# Patient Record
Sex: Male | Born: 1950 | Race: Black or African American | Hispanic: No | Marital: Single | Smoking: Current every day smoker
Health system: Southern US, Community
[De-identification: ages and names within clinical notes are randomized; demographics above are authoritative.]

## PROBLEM LIST (undated history)

## (undated) DIAGNOSIS — I509 Heart failure, unspecified: Secondary | ICD-10-CM

## (undated) DIAGNOSIS — W3400XA Accidental discharge from unspecified firearms or gun, initial encounter: Secondary | ICD-10-CM

## (undated) DIAGNOSIS — I10 Essential (primary) hypertension: Secondary | ICD-10-CM

## (undated) DIAGNOSIS — J449 Chronic obstructive pulmonary disease, unspecified: Secondary | ICD-10-CM

## (undated) DIAGNOSIS — I38 Endocarditis, valve unspecified: Secondary | ICD-10-CM

---

## 2008-09-06 ENCOUNTER — Emergency Department (HOSPITAL_COMMUNITY): Admission: EM | Admit: 2008-09-06 | Discharge: 2008-09-06 | Payer: Self-pay | Admitting: Emergency Medicine

## 2008-09-13 ENCOUNTER — Emergency Department (HOSPITAL_COMMUNITY): Admission: EM | Admit: 2008-09-13 | Discharge: 2008-09-13 | Payer: Self-pay | Admitting: Emergency Medicine

## 2008-09-18 ENCOUNTER — Encounter: Admission: RE | Admit: 2008-09-18 | Discharge: 2008-09-18 | Payer: Self-pay | Admitting: Family Medicine

## 2008-10-06 ENCOUNTER — Emergency Department (HOSPITAL_COMMUNITY): Admission: EM | Admit: 2008-10-06 | Discharge: 2008-10-06 | Payer: Self-pay | Admitting: Emergency Medicine

## 2008-10-13 ENCOUNTER — Emergency Department (HOSPITAL_COMMUNITY): Admission: EM | Admit: 2008-10-13 | Discharge: 2008-10-13 | Payer: Self-pay | Admitting: Emergency Medicine

## 2008-11-04 ENCOUNTER — Emergency Department (HOSPITAL_COMMUNITY): Admission: EM | Admit: 2008-11-04 | Discharge: 2008-11-04 | Payer: Self-pay | Admitting: Emergency Medicine

## 2009-09-11 ENCOUNTER — Inpatient Hospital Stay (HOSPITAL_COMMUNITY): Admission: EM | Admit: 2009-09-11 | Discharge: 2009-09-14 | Payer: Self-pay | Admitting: Emergency Medicine

## 2009-09-11 ENCOUNTER — Ambulatory Visit: Payer: Self-pay | Admitting: Cardiovascular Disease

## 2009-09-13 ENCOUNTER — Encounter: Payer: Self-pay | Admitting: Internal Medicine

## 2009-09-14 ENCOUNTER — Encounter (INDEPENDENT_AMBULATORY_CARE_PROVIDER_SITE_OTHER): Payer: Self-pay | Admitting: Internal Medicine

## 2009-11-05 ENCOUNTER — Emergency Department: Payer: Self-pay | Admitting: Internal Medicine

## 2009-12-22 ENCOUNTER — Emergency Department: Payer: Self-pay | Admitting: Emergency Medicine

## 2010-01-18 ENCOUNTER — Emergency Department: Payer: Self-pay | Admitting: Emergency Medicine

## 2010-02-09 ENCOUNTER — Emergency Department: Payer: Self-pay | Admitting: Emergency Medicine

## 2010-03-11 ENCOUNTER — Emergency Department: Payer: Self-pay | Admitting: Emergency Medicine

## 2010-04-14 ENCOUNTER — Emergency Department: Payer: Self-pay | Admitting: Emergency Medicine

## 2010-09-19 LAB — BASIC METABOLIC PANEL
BUN: 22 mg/dL (ref 6–23)
CO2: 25 mEq/L (ref 19–32)
CO2: 26 mEq/L (ref 19–32)
CO2: 28 mEq/L (ref 19–32)
Calcium: 8.8 mg/dL (ref 8.4–10.5)
Calcium: 9.3 mg/dL (ref 8.4–10.5)
Chloride: 109 mEq/L (ref 96–112)
Creatinine, Ser: 1.01 mg/dL (ref 0.4–1.5)
Creatinine, Ser: 1.2 mg/dL (ref 0.4–1.5)
GFR calc Af Amer: >60 mL/min (ref >60)
GFR calc Af Amer: >60 mL/min (ref >60)
GFR calc non Af Amer: 55 mL/min — ABNORMAL LOW (ref >60)
Glucose, Bld: 106 mg/dL — ABNORMAL HIGH (ref 70–99)
Glucose, Bld: 107 mg/dL — ABNORMAL HIGH (ref 70–99)
Glucose, Bld: 84 mg/dL (ref 70–99)
Potassium: 3.7 mEq/L (ref 3.5–5.1)
Sodium: 138 mEq/L (ref 135–145)
Sodium: 139 mEq/L (ref 135–145)

## 2010-09-19 LAB — CBC
HCT: 42.8 % (ref 39.0–52.0)
Hemoglobin: 12.7 g/dL — ABNORMAL LOW (ref 13.0–17.0)
Hemoglobin: 14.2 g/dL (ref 13.0–17.0)
MCHC: 33 g/dL (ref 30.0–36.0)
MCHC: 33.1 g/dL (ref 30.0–36.0)
MCV: 98.7 fL (ref 78.0–100.0)
RBC: 3.9 MIL/uL — ABNORMAL LOW (ref 4.22–5.81)
RBC: 4.38 MIL/uL (ref 4.22–5.81)
RDW: 14.2 % (ref 11.5–15.5)

## 2010-09-19 LAB — TSH: TSH: 1.302 u[IU]/mL (ref 0.350–4.500)

## 2010-09-19 LAB — HEPATIC FUNCTION PANEL
ALT: 12 U/L (ref 0–53)
AST: 21 U/L (ref 0–37)
Albumin: 3.2 g/dL — ABNORMAL LOW (ref 3.5–5.2)
Alkaline Phosphatase: 51 U/L (ref 39–117)
Bilirubin, Direct: 0.1 mg/dL (ref 0.0–0.3)
Total Bilirubin: 0.5 mg/dL (ref 0.3–1.2)

## 2010-09-19 LAB — POCT CARDIAC MARKERS
CKMB, poc: 2.2 ng/mL (ref 1.0–8.0)
Troponin i, poc: <0.05 ng/mL (ref 0.00–0.09)

## 2010-09-19 LAB — DIFFERENTIAL
Eosinophils Relative: 5 % (ref 0–5)
Lymphocytes Relative: 40 % (ref 12–46)
Monocytes Absolute: 0.4 10*3/uL (ref 0.1–1.0)
Monocytes Relative: 9 % (ref 3–12)
Neutro Abs: 2.3 10*3/uL (ref 1.7–7.7)

## 2010-09-19 LAB — RAPID URINE DRUG SCREEN, HOSP PERFORMED
Barbiturates: NOT DETECTED
Benzodiazepines: NOT DETECTED

## 2010-09-19 LAB — URINALYSIS, ROUTINE W REFLEX MICROSCOPIC
Bilirubin Urine: NEGATIVE
Hgb urine dipstick: NEGATIVE
Ketones, ur: NEGATIVE mg/dL
Nitrite: NEGATIVE
Protein, ur: NEGATIVE mg/dL
Urobilinogen, UA: 0.2 mg/dL (ref 0.0–1.0)

## 2010-09-19 LAB — CARDIAC PANEL(CRET KIN+CKTOT+MB+TROPI): CK, MB: 3.4 ng/mL (ref 0.3–4.0)

## 2010-09-19 LAB — CK TOTAL AND CKMB (NOT AT ARMC)
CK, MB: 4.7 ng/mL — ABNORMAL HIGH (ref 0.3–4.0)
Relative Index: 3.5 — ABNORMAL HIGH (ref 0.0–2.5)

## 2010-09-19 LAB — HEMOGLOBIN A1C: Mean Plasma Glucose: 126 mg/dL

## 2010-09-19 LAB — LIPID PANEL
Cholesterol: 151 mg/dL (ref 0–200)
Triglycerides: 51 mg/dL (ref <150)

## 2010-09-19 LAB — TROPONIN I: Troponin I: 0.01 ng/mL (ref 0.00–0.06)

## 2010-10-04 LAB — DIFFERENTIAL
Basophils Absolute: 0 10*3/uL (ref 0.0–0.1)
Lymphocytes Relative: 22 % (ref 12–46)
Monocytes Absolute: 0.4 10*3/uL (ref 0.1–1.0)
Monocytes Relative: 9 % (ref 3–12)
Neutro Abs: 2.8 10*3/uL (ref 1.7–7.7)

## 2010-10-04 LAB — POCT CARDIAC MARKERS
CKMB, poc: 4.7 ng/mL (ref 1.0–8.0)
Troponin i, poc: <0.05 ng/mL (ref 0.00–0.09)

## 2010-10-04 LAB — BASIC METABOLIC PANEL
Calcium: 9.2 mg/dL (ref 8.4–10.5)
GFR calc Af Amer: >60 mL/min (ref >60)
GFR calc non Af Amer: >60 mL/min (ref >60)
Sodium: 139 mEq/L (ref 135–145)

## 2010-10-04 LAB — RAPID URINE DRUG SCREEN, HOSP PERFORMED
Amphetamines: NOT DETECTED
Barbiturates: NOT DETECTED
Tetrahydrocannabinol: NOT DETECTED

## 2010-10-04 LAB — CBC
Hemoglobin: 13.8 g/dL (ref 13.0–17.0)
RBC: 4.26 MIL/uL (ref 4.22–5.81)

## 2015-01-30 ENCOUNTER — Other Ambulatory Visit: Payer: Self-pay

## 2015-01-30 ENCOUNTER — Encounter: Payer: Self-pay | Admitting: Emergency Medicine

## 2015-01-30 ENCOUNTER — Emergency Department
Admission: EM | Admit: 2015-01-30 | Discharge: 2015-01-30 | Disposition: A | Discharge status: Home / Self Care | Payer: Medicare Other | Attending: Emergency Medicine | Admitting: Emergency Medicine

## 2015-01-30 DIAGNOSIS — J441 Chronic obstructive pulmonary disease with (acute) exacerbation: Secondary | ICD-10-CM | POA: Insufficient documentation

## 2015-01-30 DIAGNOSIS — Z720 Tobacco use: Secondary | ICD-10-CM | POA: Diagnosis not present

## 2015-01-30 DIAGNOSIS — Z760 Encounter for issue of repeat prescription: Secondary | ICD-10-CM

## 2015-01-30 DIAGNOSIS — J449 Chronic obstructive pulmonary disease, unspecified: Secondary | ICD-10-CM

## 2015-01-30 DIAGNOSIS — I10 Essential (primary) hypertension: Secondary | ICD-10-CM | POA: Diagnosis not present

## 2015-01-30 HISTORY — DX: Essential (primary) hypertension: I10

## 2015-01-30 HISTORY — DX: Accidental discharge from unspecified firearms or gun, initial encounter: W34.00XA

## 2015-01-30 HISTORY — DX: Chronic obstructive pulmonary disease, unspecified: J44.9

## 2015-01-30 HISTORY — DX: Endocarditis, valve unspecified: I38

## 2015-01-30 MED ORDER — FLUTICASONE-SALMETEROL 500-50 MCG/DOSE IN AEPB: 1.0000 | INHALATION_SPRAY | Freq: Two times a day (BID) | RESPIRATORY_TRACT | Status: DC

## 2015-01-30 MED ORDER — ALBUTEROL SULFATE HFA 108 (90 BASE) MCG/ACT IN AERS: 2.0000 | INHALATION_SPRAY | Freq: Four times a day (QID) | RESPIRATORY_TRACT | Status: DC | PRN

## 2015-01-30 NOTE — Discharge Instructions (Signed)
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
## 2015-01-30 NOTE — ED Notes (Signed)
Pt to lobby to call for ride. Meal and drink given as well as a blanket given to take to the lobby.

## 2015-01-30 NOTE — ED Notes (Signed)
Pt arrived via EMS from home.  Pt states he is unable to get his prescriptions from the pharmacy Advair and Proair.  Pt states the pharmacy is holding his medications. Pt is trying to refill medications 10 days too soon. Denies any chest pain or respiratory difficulty. Pt states he is not short of breath right now but that it comes and goes. BP elevated also and is on blood pressure medications but does not know the name.

## 2015-11-16 ENCOUNTER — Emergency Department
Admission: EM | Admit: 2015-11-16 | Discharge: 2015-11-16 | Disposition: A | Discharge status: Home / Self Care | Payer: Medicare Other | Attending: Emergency Medicine | Admitting: Emergency Medicine

## 2015-11-16 ENCOUNTER — Encounter: Payer: Self-pay | Admitting: Emergency Medicine

## 2015-11-16 ENCOUNTER — Emergency Department: Payer: Medicare Other

## 2015-11-16 DIAGNOSIS — F17210 Nicotine dependence, cigarettes, uncomplicated: Secondary | ICD-10-CM | POA: Diagnosis not present

## 2015-11-16 DIAGNOSIS — Z760 Encounter for issue of repeat prescription: Secondary | ICD-10-CM | POA: Diagnosis not present

## 2015-11-16 DIAGNOSIS — I10 Essential (primary) hypertension: Secondary | ICD-10-CM | POA: Insufficient documentation

## 2015-11-16 DIAGNOSIS — J42 Unspecified chronic bronchitis: Secondary | ICD-10-CM | POA: Diagnosis not present

## 2015-11-16 DIAGNOSIS — R0600 Dyspnea, unspecified: Secondary | ICD-10-CM | POA: Diagnosis present

## 2015-11-16 DIAGNOSIS — J410 Simple chronic bronchitis: Secondary | ICD-10-CM

## 2015-11-16 MED ORDER — LISINOPRIL-HYDROCHLOROTHIAZIDE 10-12.5 MG PO TABS: 1.0000 | ORAL_TABLET | Freq: Every day | ORAL | Status: AC

## 2015-11-16 MED ORDER — ALBUTEROL SULFATE HFA 108 (90 BASE) MCG/ACT IN AERS: 2.0000 | INHALATION_SPRAY | Freq: Four times a day (QID) | RESPIRATORY_TRACT | Status: AC | PRN

## 2015-11-16 MED ORDER — FLUTICASONE-SALMETEROL 250-50 MCG/DOSE IN AEPB: 1.0000 | INHALATION_SPRAY | Freq: Two times a day (BID) | RESPIRATORY_TRACT | Status: AC

## 2015-11-16 MED ORDER — LISINOPRIL 10 MG PO TABS
20.0000 mg | ORAL_TABLET | Freq: Once | ORAL | Status: AC
  Administered 2015-11-16: 20 mg via ORAL
  Filled 2015-11-16: qty 2

## 2015-11-16 NOTE — ED Notes (Addendum)
Charted on wrong pt.
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
## 2015-11-16 NOTE — Discharge Instructions (Signed)
Chronic Obstructive Pulmonary Disease Chronic obstructive pulmonary disease (COPD) is a common lung condition in which airflow from the lungs is limited. COPD is a general term that can be used to describe many different lung problems that limit airflow, including both chronic bronchitis and emphysema. If you have COPD, your lung function will probably never return to normal, but there are measures you can take to improve lung function and make yourself feel better. CAUSES   Smoking (common).  Exposure to secondhand smoke.  Genetic problems.  Chronic inflammatory lung diseases or recurrent infections. SYMPTOMS  Shortness of breath, especially with physical activity.  Deep, persistent (chronic) cough with a large amount of thick mucus.  Wheezing.  Rapid breaths (tachypnea).  Gray or bluish discoloration (cyanosis) of the skin, especially in your fingers, toes, or lips.  Fatigue.  Weight loss.  Frequent infections or episodes when breathing symptoms become much worse (exacerbations).  Chest tightness. DIAGNOSIS Your health care provider will take a medical history and perform a physical examination to diagnose COPD. Additional tests for COPD may include:  Lung (pulmonary) function tests.  Chest X-ray.  CT scan.  Blood tests. TREATMENT  Treatment for COPD may include:  Inhaler and nebulizer medicines. These help manage the symptoms of COPD and make your breathing more comfortable.  Supplemental oxygen. Supplemental oxygen is only helpful if you have a low oxygen level in your blood.  Exercise and physical activity. These are beneficial for nearly all people with COPD.  Lung surgery or transplant.  Nutrition therapy to gain weight, if you are underweight.  Pulmonary rehabilitation. This may involve working with a team of health care providers and specialists, such as respiratory, occupational, and physical therapists. HOME CARE INSTRUCTIONS  Take all medicines  (inhaled or pills) as directed by your health care provider.  Avoid over-the-counter medicines or cough syrups that dry up your airway (such as antihistamines) and slow down the elimination of secretions unless instructed otherwise by your health care provider.  If you are a smoker, the most important thing that you can do is stop smoking. Continuing to smoke will cause further lung damage and breathing trouble. Ask your health care provider for help with quitting smoking. He or she can direct you to community resources or hospitals that provide support.  Avoid exposure to irritants such as smoke, chemicals, and fumes that aggravate your breathing.  Use oxygen therapy and pulmonary rehabilitation if directed by your health care provider. If you require home oxygen therapy, ask your health care provider whether you should purchase a pulse oximeter to measure your oxygen level at home.  Avoid contact with individuals who have a contagious illness.  Avoid extreme temperature and humidity changes.  Eat healthy foods. Eating smaller, more frequent meals and resting before meals may help you maintain your strength.  Stay active, but balance activity with periods of rest. Exercise and physical activity will help you maintain your ability to do things you want to do.  Preventing infection and hospitalization is very important when you have COPD. Make sure to receive all the vaccines your health care provider recommends, especially the pneumococcal and influenza vaccines. Ask your health care provider whether you need a pneumonia vaccine.  Learn and use relaxation techniques to manage stress.  Learn and use controlled breathing techniques as directed by your health care provider. Controlled breathing techniques include:  Pursed lip breathing. Start by breathing in (inhaling) through your nose for 1 second. Then, purse your lips as if you were   going to whistle and breathe out (exhale) through the  pursed lips for 2 seconds.  Diaphragmatic breathing. Start by putting one hand on your abdomen just above your waist. Inhale slowly through your nose. The hand on your abdomen should move out. Then purse your lips and exhale slowly. You should be able to feel the hand on your abdomen moving in as you exhale.  Learn and use controlled coughing to clear mucus from your lungs. Controlled coughing is a series of short, progressive coughs. The steps of controlled coughing are: 1. Lean your head slightly forward. 2. Breathe in deeply using diaphragmatic breathing. 3. Try to hold your breath for 3 seconds. 4. Keep your mouth slightly open while coughing twice. 5. Spit any mucus out into a tissue. 6. Rest and repeat the steps once or twice as needed. SEEK MEDICAL CARE IF:  You are coughing up more mucus than usual.  There is a change in the color or thickness of your mucus.  Your breathing is more labored than usual.  Your breathing is faster than usual. SEEK IMMEDIATE MEDICAL CARE IF:  You have shortness of breath while you are resting.  You have shortness of breath that prevents you from:  Being able to talk.  Performing your usual physical activities.  You have chest pain lasting longer than 5 minutes.  Your skin color is more cyanotic than usual.  You measure low oxygen saturations for longer than 5 minutes with a pulse oximeter. MAKE SURE YOU:  Understand these instructions.  Will watch your condition.  Will get help right away if you are not doing well or get worse.   This information is not intended to replace advice given to you by your health care provider. Make sure you discuss any questions you have with your health care provider.   Document Released: 03/22/2005 Document Revised: 07/03/2014 Document Reviewed: 02/06/2013 Elsevier Interactive Patient Education 2016 [employer]  

## 2015-11-16 NOTE — ED Notes (Signed)
Pt ems from home for increasing shortness of breath. Pt ran out of advair, proair, and ventolin approx 2 weeks ago. Also has not taken bp meds since 2016. Per ems pt is schizophrenic not being treated at this time.

## 2016-09-09 ENCOUNTER — Encounter: Payer: Self-pay | Admitting: Emergency Medicine

## 2016-09-09 ENCOUNTER — Emergency Department: Payer: Medicare Other

## 2016-09-09 ENCOUNTER — Inpatient Hospital Stay
Admission: EM | Admit: 2016-09-09 | Discharge: 2016-09-13 | DRG: 190 | Disposition: A | Discharge status: Home / Self Care | Payer: Medicare Other | Attending: Internal Medicine | Admitting: Internal Medicine

## 2016-09-09 DIAGNOSIS — R451 Restlessness and agitation: Secondary | ICD-10-CM | POA: Diagnosis present

## 2016-09-09 DIAGNOSIS — F22 Delusional disorders: Secondary | ICD-10-CM | POA: Diagnosis present

## 2016-09-09 DIAGNOSIS — J441 Chronic obstructive pulmonary disease with (acute) exacerbation: Principal | ICD-10-CM | POA: Diagnosis present

## 2016-09-09 DIAGNOSIS — I25119 Atherosclerotic heart disease of native coronary artery with unspecified angina pectoris: Secondary | ICD-10-CM | POA: Diagnosis present

## 2016-09-09 DIAGNOSIS — M79606 Pain in leg, unspecified: Secondary | ICD-10-CM | POA: Diagnosis present

## 2016-09-09 DIAGNOSIS — G8929 Other chronic pain: Secondary | ICD-10-CM | POA: Diagnosis present

## 2016-09-09 DIAGNOSIS — E43 Unspecified severe protein-calorie malnutrition: Secondary | ICD-10-CM | POA: Diagnosis present

## 2016-09-09 DIAGNOSIS — Z87828 Personal history of other (healed) physical injury and trauma: Secondary | ICD-10-CM

## 2016-09-09 DIAGNOSIS — I10 Essential (primary) hypertension: Secondary | ICD-10-CM | POA: Diagnosis present

## 2016-09-09 DIAGNOSIS — I252 Old myocardial infarction: Secondary | ICD-10-CM

## 2016-09-09 DIAGNOSIS — Z8249 Family history of ischemic heart disease and other diseases of the circulatory system: Secondary | ICD-10-CM | POA: Diagnosis not present

## 2016-09-09 DIAGNOSIS — Z79899 Other long term (current) drug therapy: Secondary | ICD-10-CM

## 2016-09-09 DIAGNOSIS — F1994 Other psychoactive substance use, unspecified with psychoactive substance-induced mood disorder: Secondary | ICD-10-CM

## 2016-09-09 DIAGNOSIS — X58XXXA Exposure to other specified factors, initial encounter: Secondary | ICD-10-CM | POA: Diagnosis not present

## 2016-09-09 DIAGNOSIS — G9341 Metabolic encephalopathy: Secondary | ICD-10-CM | POA: Diagnosis present

## 2016-09-09 DIAGNOSIS — Z681 Body mass index (BMI) 19 or less, adult: Secondary | ICD-10-CM | POA: Diagnosis not present

## 2016-09-09 DIAGNOSIS — F17210 Nicotine dependence, cigarettes, uncomplicated: Secondary | ICD-10-CM | POA: Diagnosis present

## 2016-09-09 DIAGNOSIS — J9601 Acute respiratory failure with hypoxia: Secondary | ICD-10-CM | POA: Diagnosis present

## 2016-09-09 DIAGNOSIS — W19XXXA Unspecified fall, initial encounter: Secondary | ICD-10-CM | POA: Diagnosis present

## 2016-09-09 DIAGNOSIS — Z23 Encounter for immunization: Secondary | ICD-10-CM | POA: Diagnosis present

## 2016-09-09 DIAGNOSIS — Z7951 Long term (current) use of inhaled steroids: Secondary | ICD-10-CM | POA: Diagnosis not present

## 2016-09-09 DIAGNOSIS — F1410 Cocaine abuse, uncomplicated: Secondary | ICD-10-CM | POA: Diagnosis present

## 2016-09-09 DIAGNOSIS — Z9119 Patient's noncompliance with other medical treatment and regimen: Secondary | ICD-10-CM | POA: Diagnosis not present

## 2016-09-09 DIAGNOSIS — L600 Ingrowing nail: Secondary | ICD-10-CM | POA: Diagnosis present

## 2016-09-09 DIAGNOSIS — F419 Anxiety disorder, unspecified: Secondary | ICD-10-CM | POA: Diagnosis present

## 2016-09-09 DIAGNOSIS — F10239 Alcohol dependence with withdrawal, unspecified: Secondary | ICD-10-CM | POA: Diagnosis present

## 2016-09-09 DIAGNOSIS — I38 Endocarditis, valve unspecified: Secondary | ICD-10-CM | POA: Diagnosis present

## 2016-09-09 DIAGNOSIS — Z590 Homelessness: Secondary | ICD-10-CM | POA: Diagnosis not present

## 2016-09-09 LAB — COMPREHENSIVE METABOLIC PANEL
ALBUMIN: 3.8 g/dL (ref 3.5–5.0)
ALT: 12 U/L — ABNORMAL LOW (ref 17–63)
AST: 27 U/L (ref 15–41)
Alkaline Phosphatase: 56 U/L (ref 38–126)
Anion gap: 6 (ref 5–15)
BUN: 20 mg/dL (ref 6–20)
CHLORIDE: 107 mmol/L (ref 101–111)
CO2: 28 mmol/L (ref 22–32)
Calcium: 9.1 mg/dL (ref 8.9–10.3)
Creatinine, Ser: 1.21 mg/dL (ref 0.61–1.24)
GFR calc Af Amer: >60 mL/min (ref >60)
GLUCOSE: 83 mg/dL (ref 65–99)
POTASSIUM: 4.3 mmol/L (ref 3.5–5.1)
Sodium: 141 mmol/L (ref 135–145)
TOTAL PROTEIN: 7.2 g/dL (ref 6.5–8.1)
Total Bilirubin: 0.8 mg/dL (ref 0.3–1.2)

## 2016-09-09 LAB — CBC WITH DIFFERENTIAL/PLATELET
BASOS PCT: 1 %
Basophils Absolute: 0.1 10*3/uL (ref 0–0.1)
EOS ABS: 0.1 10*3/uL (ref 0–0.7)
Eosinophils Relative: 1 %
HCT: 42 % (ref 40.0–52.0)
Hemoglobin: 14 g/dL (ref 13.0–18.0)
LYMPHS ABS: 1.2 10*3/uL (ref 1.0–3.6)
Lymphocytes Relative: 17 %
MCH: 31.8 pg (ref 26.0–34.0)
MCHC: 33.5 g/dL (ref 32.0–36.0)
MCV: 95 fL (ref 80.0–100.0)
MONOS PCT: 5 %
Monocytes Absolute: 0.3 10*3/uL (ref 0.2–1.0)
NEUTROS PCT: 76 %
Neutro Abs: 5.1 10*3/uL (ref 1.4–6.5)
Platelets: 226 10*3/uL (ref 150–440)
RBC: 4.42 MIL/uL (ref 4.40–5.90)
RDW: 14.7 % — AB (ref 11.5–14.5)
WBC: 6.7 10*3/uL (ref 3.8–10.6)

## 2016-09-09 LAB — TROPONIN I

## 2016-09-09 LAB — MAGNESIUM: Magnesium: 1.9 mg/dL (ref 1.7–2.4)

## 2016-09-09 MED ORDER — ENOXAPARIN SODIUM 40 MG/0.4ML ~~LOC~~ SOLN
40.0000 mg | SUBCUTANEOUS | Status: DC
  Administered 2016-09-09 – 2016-09-12 (×2): 40 mg via SUBCUTANEOUS
  Filled 2016-09-09 (×2): qty 0.4

## 2016-09-09 MED ORDER — ONDANSETRON HCL 4 MG PO TABS: 4.0000 mg | ORAL_TABLET | Freq: Four times a day (QID) | ORAL | Status: DC | PRN

## 2016-09-09 MED ORDER — POLYETHYLENE GLYCOL 3350 17 G PO PACK
17.0000 g | PACK | Freq: Every day | ORAL | Status: DC | PRN
Start: 2016-09-09 — End: 2016-09-13

## 2016-09-09 MED ORDER — PNEUMOCOCCAL VAC POLYVALENT 25 MCG/0.5ML IJ INJ
0.5000 mL | INJECTION | INTRAMUSCULAR | Status: AC
  Administered 2016-09-10: 07:00:00 0.5 mL via INTRAMUSCULAR
  Filled 2016-09-09: qty 0.5

## 2016-09-09 MED ORDER — INFLUENZA VAC SPLIT QUAD 0.5 ML IM SUSY
0.5000 mL | PREFILLED_SYRINGE | INTRAMUSCULAR | Status: AC
  Administered 2016-09-10: 08:00:00 0.5 mL via INTRAMUSCULAR
  Filled 2016-09-09: qty 0.5

## 2016-09-09 MED ORDER — ACETAMINOPHEN 325 MG PO TABS: 650.0000 mg | ORAL_TABLET | Freq: Four times a day (QID) | ORAL | Status: DC | PRN

## 2016-09-09 MED ORDER — ALBUTEROL SULFATE (2.5 MG/3ML) 0.083% IN NEBU
5.0000 mg | INHALATION_SOLUTION | Freq: Once | RESPIRATORY_TRACT | Status: AC
  Administered 2016-09-09: 5 mg via RESPIRATORY_TRACT
  Filled 2016-09-09: qty 6

## 2016-09-09 MED ORDER — SODIUM CHLORIDE 0.9% FLUSH
3.0000 mL | Freq: Two times a day (BID) | INTRAVENOUS | Status: DC
  Administered 2016-09-09 – 2016-09-13 (×9): 3 mL via INTRAVENOUS

## 2016-09-09 MED ORDER — LISINOPRIL 10 MG PO TABS
10.0000 mg | ORAL_TABLET | Freq: Every day | ORAL | Status: DC
  Administered 2016-09-09 – 2016-09-13 (×5): 10 mg via ORAL
  Filled 2016-09-09 (×5): qty 1

## 2016-09-09 MED ORDER — ORAL CARE MOUTH RINSE
15.0000 mL | Freq: Two times a day (BID) | OROMUCOSAL | Status: DC
  Administered 2016-09-09 – 2016-09-11 (×5): 15 mL via OROMUCOSAL

## 2016-09-09 MED ORDER — ACETAMINOPHEN 650 MG RE SUPP: 650.0000 mg | Freq: Four times a day (QID) | RECTAL | Status: DC | PRN

## 2016-09-09 MED ORDER — SODIUM CHLORIDE 0.9% FLUSH: 3.0000 mL | INTRAVENOUS | Status: DC | PRN

## 2016-09-09 MED ORDER — ONDANSETRON HCL 4 MG/2ML IJ SOLN: 4.0000 mg | Freq: Four times a day (QID) | INTRAMUSCULAR | Status: DC | PRN

## 2016-09-09 MED ORDER — LISINOPRIL-HYDROCHLOROTHIAZIDE 10-12.5 MG PO TABS: 1.0000 | ORAL_TABLET | Freq: Every day | ORAL | Status: DC

## 2016-09-09 MED ORDER — MOMETASONE FURO-FORMOTEROL FUM 200-5 MCG/ACT IN AERO
2.0000 | INHALATION_SPRAY | Freq: Two times a day (BID) | RESPIRATORY_TRACT | Status: DC
  Administered 2016-09-09 – 2016-09-13 (×9): 2 via RESPIRATORY_TRACT
  Filled 2016-09-09 (×2): qty 8.8

## 2016-09-09 MED ORDER — HYDROCHLOROTHIAZIDE 12.5 MG PO CAPS
12.5000 mg | ORAL_CAPSULE | Freq: Every day | ORAL | Status: DC
  Administered 2016-09-09 – 2016-09-13 (×5): 12.5 mg via ORAL
  Filled 2016-09-09 (×6): qty 1

## 2016-09-09 MED ORDER — SODIUM CHLORIDE 0.9 % IV SOLN: 250.0000 mL | INTRAVENOUS | Status: DC | PRN

## 2016-09-09 MED ORDER — METHYLPREDNISOLONE SODIUM SUCC 125 MG IJ SOLR
60.0000 mg | Freq: Two times a day (BID) | INTRAMUSCULAR | Status: DC
  Administered 2016-09-09 – 2016-09-12 (×6): 60 mg via INTRAVENOUS
  Filled 2016-09-09 (×6): qty 2

## 2016-09-09 MED ORDER — ALBUTEROL SULFATE (2.5 MG/3ML) 0.083% IN NEBU
2.5000 mg | INHALATION_SOLUTION | RESPIRATORY_TRACT | Status: DC | PRN
  Administered 2016-09-09 – 2016-09-11 (×3): 2.5 mg via RESPIRATORY_TRACT
  Filled 2016-09-09 (×3): qty 3

## 2016-09-09 MED ORDER — IPRATROPIUM-ALBUTEROL 0.5-2.5 (3) MG/3ML IN SOLN
3.0000 mL | Freq: Once | RESPIRATORY_TRACT | Status: AC
  Administered 2016-09-09: 3 mL via RESPIRATORY_TRACT
  Filled 2016-09-09: qty 3

## 2016-09-09 NOTE — ED Notes (Signed)
Pt ambulated from rm 18 down hallway in CDU to the bathroom. Pt O2 stayed between 86%-91% and HR between 74bpm-110bpm. Pt assisted to toilet to urinate once back in rm and placed back on cardiac monitor. Marylene Landngela notified about these findings.

## 2016-09-09 NOTE — ED Triage Notes (Signed)
Pt presents to ED 18 c/o Shortness of Breath via EMS; per EMS pt was on his porch severely short of breah and O2 Sats at 92%, and BP of 158/125; EMS gave 2 nebulizer treatments and 125 mg of Solumedrol; pt has history of COPD, and HTN; pt states taking dose of his inhaler prior to calling for EMS; pt states no known allergies

## 2016-09-09 NOTE — Progress Notes (Signed)
Noted respirations slightly labored at rest. Pt states a nebs treatment is desired with respiratory notified.

## 2016-09-09 NOTE — Progress Notes (Signed)
Pt gets very dysneic on minimal exertion with desat reported by CNA on way back from [place] which resolved with rest.
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
## 2016-09-09 NOTE — Progress Notes (Signed)
Pt c/o of foot pain when removed shoes; found pack of cigarettes inside sock which RN removed from room and placed in med drawer. Pt educated on [place] Tobacco Free policy. Found severely overgrown toenails, curved and growing into other toes, severe scaling of skin with very poor hygiene. Has multiple thick scabs, callouses, and protruding callous horn shaped overgrowths on sole of feet/sides of feet. Pt agreeable for foot care but tolerated fair even with gentle cleansing with soap/water with lotion applied and large yellow socks due to tenderness and "tickling". Pt smells like cigarette smoke and body odor like poor hygiene. Pt had no underwear on. Ventolin inhaler was empty with no med left in. Switch blade knife, cig lighter was removed and placed in pt's med box with cigs to be returned on discharge.  Pt slow to answer questions; delayed responses with descrepancies whether or not he lived alone or with someone.
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
## 2016-09-10 MED ORDER — AZITHROMYCIN 500 MG PO TABS
500.0000 mg | ORAL_TABLET | Freq: Every day | ORAL | Status: DC
  Administered 2016-09-10 – 2016-09-13 (×4): 500 mg via ORAL
  Filled 2016-09-10 (×4): qty 1

## 2016-09-10 MED ORDER — ORAL CARE MOUTH RINSE: 15.0000 mL | Freq: Two times a day (BID) | OROMUCOSAL | Status: DC

## 2016-09-10 NOTE — Progress Notes (Signed)
Initial Nutrition Assessment  DOCUMENTATION CODES:   Not applicable  INTERVENTION:  1. Magic cup TID with meals, each supplement provides 290 kcal and 9 grams of protein 2. Provide snacks as requested  NUTRITION DIAGNOSIS:   Inadequate oral intake related to poor appetite as evidenced by per patient/family report.  GOAL:   Patient will meet greater than or equal to 90% of their needs  MONITOR:   PO intake, I & O's, Labs, Weight trends, Supplement acceptance  REASON FOR ASSESSMENT:   Malnutrition Screening Tool    ASSESSMENT:   Kenneth Sutton  is a 66 y.o. male with a known history of COPD, HTN non compliance here with SOB, wheezing and not feeling well. Sats 86% on ambulation in hallway inspite of solumedrol and nebs.  Attempted to speak with Kenneth Sutton at bedside, he is pretty irritable.  Refused nutrition focused physical assessment but appears as though he could be moderately-severely malnourished. Would not provide any history in terms of weight, PO intake. Denied nausea/vomiting/diarrhea/constipation. Documented PO intake 100% for 2 meals until breakfast this morning. Only complaint was he wanted more food on trays. Offered ensure which he declined, encouraged to ask for snacks in between meals, consume magic cup on trays. Labs and medications reviewed: Solumedrol  Diet Order:  Diet 2 gram sodium Room service appropriate? Yes; Fluid consistency: Thin  Skin:  Reviewed, no issues  Last BM:  09/08/2016  Height:   Ht Readings from Last 1 Encounters:  09/09/16 5\' 8"  (1.727 m)    Weight:   Wt Readings from Last 1 Encounters:  09/10/16 123 lb 6.4 oz (56 kg)    Ideal Body Weight:  70 kg  BMI:  Body mass index is 18.76 kg/m.  Estimated Nutritional Needs:   Kcal:  1400-1700 calories  Protein:  56-67 gm  Fluid:  >/= 1.4L  EDUCATION NEEDS:   No education needs identified at this time  Dionne AnoWilliam M. Mont Jagoda, MS, RD LDN Inpatient Clinical Dietitian Pager  [phone]

## 2016-09-10 NOTE — Progress Notes (Signed)
Pt ambulated in hall with pulse ox on: at rest before walk with HR 77 with pulse ox 94% with 2l/[place]. Pt became very dysneic, labored resp ambulating to RM 117 with HR 105 with pulse ox 81 % RA. Pt had to stop to breathe to return to room. Pt returned to bed with 02 reapplied with HR 95 with pulse ox 93 % on 2l/[place].

## 2016-09-10 NOTE — Plan of Care (Signed)
Problem: Activity: Goal: Ability to tolerate increased activity will improve Outcome: Not Progressing Pt becomes very dysneic with labored respiration with pulse ox desat to 81 % on ambulation on RA.; resolves with rest.   Problem: Respiratory: Goal: Ability to maintain adequate ventilation will improve Outcome: Not Progressing Desats on minimal exertion without 02.

## 2016-09-10 NOTE — Progress Notes (Signed)
[facility] - [place] at [facility]   PATIENT NAME: Kenneth Sutton    MR#:  [id number]  DATE OF BIRTH:  24-May-1951  SUBJECTIVE:  CHIEF COMPLAINT:   Chief Complaint  Patient presents with  . Shortness of Breath   Still extremely SOB on minimal ambulation Dry cough  Poor historian  Unclear about his living situation Still on 2 L O2  REVIEW OF SYSTEMS:    Review of Systems  Constitutional: Positive for malaise/fatigue. Negative for chills and fever.  HENT: Negative for sore throat.   Eyes: Negative for blurred vision, double vision and pain.  Respiratory: Positive for cough and shortness of breath. Negative for hemoptysis and wheezing.   Cardiovascular: Negative for chest pain, palpitations, orthopnea and leg swelling.  Gastrointestinal: Negative for abdominal pain, constipation, diarrhea, heartburn, nausea and vomiting.  Genitourinary: Negative for dysuria and hematuria.  Musculoskeletal: Negative for back pain and joint pain.  Skin: Negative for rash.  Neurological: Negative for sensory change, speech change, focal weakness and headaches.  Endo/Heme/Allergies: Does not bruise/bleed easily.  Psychiatric/Behavioral: Negative for depression. The patient is not nervous/anxious.     DRUG ALLERGIES:  No Known Allergies  VITALS:  Blood pressure (!) 148/73, pulse 67, temperature 98 F (36.7 C), temperature source Oral, resp. rate 20, height 5\' 8"  (1.727 m), weight 56 kg (123 lb 6.4 oz), SpO2 97 %.  PHYSICAL EXAMINATION:   Physical Exam  GENERAL:  66 y.o.-year-old patient lying in the bed with conversational dyspnea EYES: Pupils equal, round, reactive to light and accommodation. No scleral icterus. Extraocular muscles intact.  HEENT: Head atraumatic, normocephalic. Oropharynx and nasopharynx clear.  NECK:  Supple, no jugular venous distention. No thyroid enlargement, no tenderness.  LUNGS: Normal breath sounds bilaterally, no wheezing, rales, rhonchi. No use  of accessory muscles of respiration.  CARDIOVASCULAR: S1, S2 normal. No murmurs, rubs, or gallops.  ABDOMEN: Soft, nontender, nondistended. Bowel sounds present. No organomegaly or mass.  EXTREMITIES: No cyanosis, clubbing or edema b/l.   Multiple ingrown toe nails NEUROLOGIC: Cranial nerves II through XII are intact. No focal Motor or sensory deficits b/l.   PSYCHIATRIC: The patient is alert and oriented x 3.  SKIN: No obvious rash, lesion, or ulcer.   LABORATORY PANEL:   CBC  Recent Labs Lab 09/09/16 0816  WBC 6.7  HGB 14.0  HCT 42.0  PLT 226   ------------------------------------------------------------------------------------------------------------------ Chemistries   Recent Labs Lab 09/09/16 0648  NA 141  K 4.3  CL 107  CO2 28  GLUCOSE 83  BUN 20  CREATININE 1.21  CALCIUM 9.1  MG 1.9  AST 27  ALT 12*  ALKPHOS 56  BILITOT 0.8   ------------------------------------------------------------------------------------------------------------------  Cardiac Enzymes  Recent Labs Lab 09/09/16 0648  TROPONINI <0.03   ------------------------------------------------------------------------------------------------------------------  RADIOLOGY:  Dg Chest 2 View  Result Date: 09/09/2016 CLINICAL DATA:  Shortness of breath beginning last night. EXAM: CHEST  2 VIEW COMPARISON:  11/16/2015 FINDINGS: Heart size is normal. There is aortic atherosclerosis. The pulmonary vascularity is normal. Lungs are clear. No infiltrate, collapse or effusion. No acute bone finding. There are 2 rounded densities projected over the left chest that were not present on the previous study. Question of these represent new calcified granulomas ,new sclerotic rib densities or artifactual densities. Suggest two-view chest radiography when patient is stable. IMPRESSION: No active cardiopulmonary disease. Two probably insignificant or low significant densities projected over the left chest. To clarify  these, two-view standard radiography would be suggested when able. Electronically Signed  By: Paulina Fusi M.D.   On: 09/09/2016 07:12     ASSESSMENT AND PLAN:   * Acute copd exacerbation with acute hypoxic resp failure POA -IV steroids, Antibiotics - Scheduled Nebulizers - Inhalers -Wean O2 as tolerated - Consult pulmonary if no improvement  Still has SOB on 2-3 steps ambulating  * Hypertension - Improved Lisinopril and HCTZ  * Tobacco use Counseled on admission  * Ingrown toe nails Refer to podiatry on discharge  * DVT prophylaxis Lovenox  All the records are reviewed and case discussed with Care Management/Social Worker Management plans discussed with the patient, family and they are in agreement.  CODE STATUS: FULL CODE  DVT Prophylaxis: SCDs  TOTAL TIME TAKING CARE OF THIS PATIENT: 30 minutes.   POSSIBLE D/C IN 1-2 DAYS, DEPENDING ON CLINICAL CONDITION.  Milagros Loll R M.D on 09/10/2016 at 9:07 AM  Between 7am to 6pm - Pager - [phone]  After 6pm go to [email or web address] - password [email or web address] [facility]  [facility] [place] [facility]  Office  [phone]  CC: Primary care physician; No PCP Per Patient  Note: This dictation was prepared with Dragon dictation along with smaller phrase technology. Any transcriptional errors that result from this process are unintentional.

## 2016-09-10 NOTE — Progress Notes (Addendum)
Pt reports pain/soreness upon weight bearing in feet due to lesion/severely overgrown toenails. Pt becomes extremely dysneic on minimal exertion with desat upon ambulation attempt. 02 continued at 2l/[place]. Pt demonstrates limited cognition to answer specific questions, reading and repeating back education/information.

## 2016-09-10 NOTE — Clinical Social Work Note (Signed)
CSW received referral that patient does not have a PCP case manager can assist with this.  CSW to sign off please reconsult if other social work needs arise.  Ervin KnackEric R. Anshika Pethtel, MSW, Theresia MajorsLCSWA [phone]  09/10/2016 12:42 PM

## 2016-09-11 LAB — URINE DRUG SCREEN, QUALITATIVE (ARMC ONLY)
AMPHETAMINES, UR SCREEN: NOT DETECTED
Barbiturates, Ur Screen: NOT DETECTED
Benzodiazepine, Ur Scrn: NOT DETECTED
COCAINE METABOLITE, UR ~~LOC~~: POSITIVE — AB
Cannabinoid 50 Ng, Ur ~~LOC~~: NOT DETECTED
MDMA (Ecstasy)Ur Screen: NOT DETECTED
METHADONE SCREEN, URINE: NOT DETECTED
OPIATE, UR SCREEN: NOT DETECTED
PHENCYCLIDINE (PCP) UR S: NOT DETECTED
Tricyclic, Ur Screen: NOT DETECTED

## 2016-09-11 LAB — AMMONIA: Ammonia: 9 umol/L (ref 9–35)

## 2016-09-11 MED ORDER — LORAZEPAM 2 MG PO TABS
0.0000 mg | ORAL_TABLET | Freq: Four times a day (QID) | ORAL | Status: AC
  Administered 2016-09-11: 11:00:00 1 mg via ORAL
  Administered 2016-09-11: 2 mg via ORAL
  Filled 2016-09-11 (×2): qty 1

## 2016-09-11 MED ORDER — VITAMIN B-1 100 MG PO TABS
100.0000 mg | ORAL_TABLET | Freq: Every day | ORAL | Status: DC
  Administered 2016-09-11 – 2016-09-13 (×3): 100 mg via ORAL
  Filled 2016-09-11 (×3): qty 1

## 2016-09-11 MED ORDER — LORAZEPAM 2 MG/ML IJ SOLN: 2.0000 mg | INTRAMUSCULAR | Status: DC | PRN

## 2016-09-11 MED ORDER — LORAZEPAM 2 MG/ML IJ SOLN
INTRAMUSCULAR | Status: AC
  Filled 2016-09-11: qty 1

## 2016-09-11 MED ORDER — LORAZEPAM 2 MG PO TABS: 0.0000 mg | ORAL_TABLET | Freq: Two times a day (BID) | ORAL | Status: DC

## 2016-09-11 MED ORDER — LORAZEPAM 2 MG/ML IJ SOLN
2.0000 mg | Freq: Four times a day (QID) | INTRAMUSCULAR | Status: DC | PRN
  Administered 2016-09-11: 05:00:00 via INTRAVENOUS

## 2016-09-11 MED ORDER — FOLIC ACID 1 MG PO TABS
1.0000 mg | ORAL_TABLET | Freq: Every day | ORAL | Status: DC
  Administered 2016-09-11 – 2016-09-13 (×3): 1 mg via ORAL
  Filled 2016-09-11 (×3): qty 1

## 2016-09-11 MED ORDER — THIAMINE HCL 100 MG/ML IJ SOLN: 100.0000 mg | Freq: Every day | INTRAMUSCULAR | Status: DC

## 2016-09-11 MED ORDER — ADULT MULTIVITAMIN W/MINERALS CH
1.0000 | ORAL_TABLET | Freq: Every day | ORAL | Status: DC
  Administered 2016-09-11 – 2016-09-13 (×3): 1 via ORAL
  Filled 2016-09-11 (×3): qty 1

## 2016-09-11 NOTE — Progress Notes (Addendum)
[facility] - [place] at [facility]   PATIENT NAME: Kenneth Sutton    MR#:  [id number]  DATE OF BIRTH:  1951-02-13  SUBJECTIVE:  CHIEF COMPLAINT:   Chief Complaint  Patient presents with  . Shortness of Breath  Still extremely SOB on minimal ambulation, Dry cough, He seems agitated and impulsive.  He desaturates very easily on minimal exertion.Marland Kitchen Required Ativan overnight, and security was called to assist but as he was very aggressive and verbally abusive towards staff.  Fall precautions in place. Poor historian.   Unclear about his living situation, Still on 2 L O2 REVIEW OF SYSTEMS:    Review of Systems  Constitutional: Positive for malaise/fatigue. Negative for chills and fever.  HENT: Negative for sore throat.   Eyes: Negative for blurred vision, double vision and pain.  Respiratory: Positive for cough and shortness of breath. Negative for hemoptysis and wheezing.   Cardiovascular: Negative for chest pain, palpitations, orthopnea and leg swelling.  Gastrointestinal: Negative for abdominal pain, constipation, diarrhea, heartburn, nausea and vomiting.  Genitourinary: Negative for dysuria and hematuria.  Musculoskeletal: Negative for back pain and joint pain.  Skin: Negative for rash.  Neurological: Positive for weakness. Negative for sensory change, speech change, focal weakness and headaches.  Endo/Heme/Allergies: Does not bruise/bleed easily.  Psychiatric/Behavioral: Negative for depression. The patient is nervous/anxious.    DRUG ALLERGIES:  No Known Allergies VITALS:  Blood pressure (!) 173/99, pulse 67, temperature 98.2 F (36.8 C), resp. rate 20, height 5\' 8"  (1.727 m), weight 56.4 kg (124 lb 6.4 oz), SpO2 92 %. PHYSICAL EXAMINATION:   Physical Exam  Constitutional: He appears malnourished. He appears unhealthy. He appears cachectic. He appears toxic. He has a sickly appearance. He appears distressed.  HENT:  Head: Normocephalic and atraumatic.  Eyes:  Conjunctivae and EOM are normal. Pupils are equal, round, and reactive to light.  Neck: Normal range of motion. Neck supple. No tracheal deviation present. No thyromegaly present.  Cardiovascular: Normal rate, regular rhythm and normal heart sounds.   Pulmonary/Chest: Accessory muscle usage present. Tachypnea noted. He is in respiratory distress. He has decreased breath sounds in the right lower field and the left lower field. He has wheezes in the right lower field and the left lower field. He has rhonchi. He exhibits no tenderness.  Abdominal: Soft. Bowel sounds are normal. He exhibits no distension. There is no tenderness.  Musculoskeletal: Normal range of motion.  Neurological: He is alert. He is agitated and disoriented. No cranial nerve deficit.  She called to evaluate considering his mental status.  Skin: Skin is warm and dry. No rash noted.  Psychiatric: His mood appears anxious. His affect is inappropriate. He is agitated. He expresses impulsivity. He exhibits disordered thought content.   LABORATORY PANEL:   CBC  Recent Labs Lab 09/09/16 0816  WBC 6.7  HGB 14.0  HCT 42.0  PLT 226   ------------------------------------------------------------------------------------------------------------------ Chemistries   Recent Labs Lab 09/09/16 0648  NA 141  K 4.3  CL 107  CO2 28  GLUCOSE 83  BUN 20  CREATININE 1.21  CALCIUM 9.1  MG 1.9  AST 27  ALT 12*  ALKPHOS 56  BILITOT 0.8   ASSESSMENT AND PLAN:   66 y.o. male with a known history of COPD, HTN non compliance here with SOB, wheezing and not feeling well. Sats 86% on ambulation At rest and a lot more hypoxia on minimal exertion.  * Acute metabolic encephalopathy - Could be due to alcohol withdrawal. - Hard  to get history from him as he is a poor historian. - His admission history does state that he drinks alcohol. - We will start him on CIWA protocol. - Check urine drug screen.  Check ammonia.  * Acute copd  exacerbation with acute hypoxic resp failure POA - continue IV steroids, Antibiotics - Scheduled Nebulizers - Inhalers -Wean O2 as tolerated - Consult pulmonary if no improvement - Still has SOB on 2-3 steps ambulating  * Hypertension - uncontrolled, likely due to anxiety/agitation - Continue Lisinopril and HCTZ  * Tobacco use Counseled on admission  * Ingrown toe nails Refer to podiatry on discharge  * DVT prophylaxis Lovenox   Patient had been seen by Dr. Ellsworth Lennoxejan-Sie in the past, documentation in care everywhere.  We will request care management to make sure he has PCP and also if possible, Consider [facility] involved.  He seems at high risk for readmissions.   All the records are reviewed and case discussed with Care Management/Social Worker Management plans discussed with the patient, Nursing and they are in agreement.  CODE STATUS: FULL CODE  DVT Prophylaxis: SCDs  TOTAL TIME TAKING CARE OF THIS PATIENT: 30 minutes.   POSSIBLE D/C IN 1-2 DAYS, DEPENDING ON CLINICAL CONDITION.  Delfino LovettVipul Zaray Gatchel M.D on 09/11/2016 at 9:07 AM  Between 7am to 6pm - Pager - [phone]  After 6pm go to [email or web address] - password [email or web address] [facility]  [facility] [place] [facility]  Office  [phone]  CC: Primary care physician; Luna FuseEJAN-SIE, SHEIKH AHMED, MD  Note: This dictation was prepared with Dragon dictation along with smaller phrase technology. Any transcriptional errors that result from this process are unintentional.

## 2016-09-11 NOTE — Progress Notes (Signed)
Patient woke up to urinate, noted that he was impusive, did not press the call light and proceeded to the bathroom, urinated on the sink. He later became verbally abusive and aggressive towards staff. Security was called to assist. Obtained order from MD Pyreddy for a 1 time dose of Ativan 2mg  IV. Noted for wheezing to Bilateral lung fields, breathing treatment administered as well. Patient remained in room. Fall precautions in place. We will continue to monitor.

## 2016-09-11 NOTE — Plan of Care (Signed)
Problem: Safety: Goal: Ability to remain free from injury will improve Outcome: Progressing Pt is high fall risk, impulsive. Up with 1xassist to bathroom. Safety rounder utilized.  Pt a&ox3. Disoriented to time.Poor historian. CIWA protocol initiated. Ativan PO given once with improvement.  Problem: Respiratory: Goal: Levels of oxygenation will improve Outcome: Progressing O2 sats 100% on 2.5L per [place] at rest. Pt dyspenic on exertion and sob with ambulation to bathroom on RA.

## 2016-09-12 DIAGNOSIS — F1994 Other psychoactive substance use, unspecified with psychoactive substance-induced mood disorder: Secondary | ICD-10-CM

## 2016-09-12 DIAGNOSIS — F1410 Cocaine abuse, uncomplicated: Secondary | ICD-10-CM

## 2016-09-12 LAB — CBC
HCT: 42.9 % (ref 40.0–52.0)
Hemoglobin: 14.5 g/dL (ref 13.0–18.0)
MCH: 32.1 pg (ref 26.0–34.0)
MCHC: 33.8 g/dL (ref 32.0–36.0)
MCV: 95.1 fL (ref 80.0–100.0)
PLATELETS: 237 10*3/uL (ref 150–440)
RBC: 4.51 MIL/uL (ref 4.40–5.90)
RDW: 14.7 % — AB (ref 11.5–14.5)
WBC: 12.1 10*3/uL — AB (ref 3.8–10.6)

## 2016-09-12 LAB — CREATININE, SERUM
Creatinine, Ser: 1 mg/dL (ref 0.61–1.24)
GFR calc non Af Amer: >60 mL/min (ref >60)

## 2016-09-12 MED ORDER — QUETIAPINE FUMARATE 25 MG PO TABS
100.0000 mg | ORAL_TABLET | Freq: Every day | ORAL | Status: DC
  Administered 2016-09-12: 21:00:00 100 mg via ORAL
  Filled 2016-09-12: qty 4

## 2016-09-12 MED ORDER — METHYLPREDNISOLONE SODIUM SUCC 125 MG IJ SOLR
60.0000 mg | Freq: Every day | INTRAMUSCULAR | Status: DC
  Administered 2016-09-12 – 2016-09-13 (×2): 60 mg via INTRAVENOUS
  Filled 2016-09-12 (×2): qty 2

## 2016-09-12 NOTE — Progress Notes (Deleted)
Called report to Larita Fifeiffany McDowell RN  at [facility]

## 2016-09-12 NOTE — Progress Notes (Signed)
[facility] - [place] at [facility]   PATIENT NAME: Kenneth Sutton    MR#:  [id number]  DATE OF BIRTH:  [birth year]  SUBJECTIVE:  CHIEF COMPLAINT:   Chief Complaint  Patient presents with  . Shortness of Breath  somewhat awake and alert. Eating breakfast REVIEW OF SYSTEMS:    Review of Systems  Constitutional: Positive for malaise/fatigue. Negative for chills and fever.  HENT: Negative for sore throat.   Eyes: Negative for blurred vision, double vision and pain.  Respiratory: Positive for cough and shortness of breath. Negative for hemoptysis and wheezing.   Cardiovascular: Negative for chest pain, palpitations, orthopnea and leg swelling.  Gastrointestinal: Negative for abdominal pain, constipation, diarrhea, heartburn, nausea and vomiting.  Genitourinary: Negative for dysuria and hematuria.  Musculoskeletal: Negative for back pain and joint pain.  Skin: Negative for rash.  Neurological: Positive for weakness. Negative for sensory change, speech change, focal weakness and headaches.  Endo/Heme/Allergies: Does not bruise/bleed easily.  Psychiatric/Behavioral: Negative for depression. The patient is nervous/anxious.    DRUG ALLERGIES:  No Known Allergies VITALS:  Blood pressure 132/81, pulse 81, temperature 98.3 F (36.8 C), temperature source Oral, resp. rate 18, height 5\' 8"  (1.727 m), weight 55 kg (121 lb 3.2 oz), SpO2 100 %. PHYSICAL EXAMINATION:   Physical Exam  Constitutional: He appears malnourished. He appears unhealthy. He appears cachectic. He appears distressed.  HENT:  Head: Normocephalic and atraumatic.  Eyes: Conjunctivae and EOM are normal. Pupils are equal, round, and reactive to light.  Neck: Normal range of motion. Neck supple. No tracheal deviation present. No thyromegaly present.  Cardiovascular: Normal rate, regular rhythm and normal heart sounds.   Pulmonary/Chest: No accessory muscle usage. No tachypnea. He is in respiratory distress. He  has decreased breath sounds in the right lower field and the left lower field. He has wheezes in the right lower field and the left lower field. He has no rhonchi. He exhibits no tenderness.  Abdominal: Soft. Bowel sounds are normal. He exhibits no distension. There is no tenderness.  Musculoskeletal: Normal range of motion.  Neurological: He is alert. He is not agitated and not disoriented. No cranial nerve deficit.  Skin: Skin is warm and dry. No rash noted.  Psychiatric: His mood appears not anxious. His affect is inappropriate. He is not agitated. He does not express impulsivity. He exhibits disordered thought content.   LABORATORY PANEL:   CBC  Recent Labs Lab 09/12/16 0452  WBC 12.1*  HGB 14.5  HCT 42.9  PLT 237   ------------------------------------------------------------------------------------------------------------------ Chemistries   Recent Labs Lab 09/09/16 0648 09/12/16 0452  NA 141  --   K 4.3  --   CL 107  --   CO2 28  --   GLUCOSE 83  --   BUN 20  --   CREATININE 1.21 1.00  CALCIUM 9.1  --   MG 1.9  --   AST 27  --   ALT 12*  --   ALKPHOS 56  --   BILITOT 0.8  --    ASSESSMENT AND PLAN:   66 y.o. male with a known history of COPD, HTN non compliance here with SOB, wheezing and not feeling well. Sats 86% on ambulation At rest and a lot more hypoxia on minimal exertion.  * Acute metabolic encephalopathy - Could be due to alcohol withdrawal. - Hard to get history from him as he is a poor historian. - His admission history does state that he drinks alcohol. - Continue  CIWA protocol. - urine drug screen positive for cocaine. Normal ammonia.  * Acute copd exacerbation with acute hypoxic resp failure POA - continue IV steroids, Antibiotics - Scheduled Nebulizers - Inhalers -Wean O2 as tolerated - Consult pulmonary if no improvement - Still has SOB on 2-3 steps ambulating  * Hypertension - uncontrolled, likely due to anxiety/agitation - Continue  Lisinopril and HCTZ  * Tobacco use Counseled on admission  * Ingrown toe nails Outpt podiatry eval on discharge  * DVT prophylaxis Lovenox   Patient had been seen by Dr. Ellsworth Lennox in the past, documentation in care everywhere.  He seems at high risk for readmissions.   All the records are reviewed and case discussed with Care Management/Social Worker Management plans discussed with the patient, Nursing and they are in agreement.  CODE STATUS: FULL CODE  DVT Prophylaxis: SCDs  TOTAL TIME TAKING CARE OF THIS PATIENT: 30 minutes.   POSSIBLE D/C IN 1 DAYS, DEPENDING ON CLINICAL CONDITION. If safe dispo  Delfino Lovett M.D on 09/12/2016 at 1:04 PM  Between 7am to 6pm - Pager - [phone]  After 6pm go to [email or web address] - password [email or web address] [facility]  [facility] [place] [facility]  Office  [phone]  CC: Primary care physician; Luna Fuse, MD  Note: This dictation was prepared with Dragon dictation along with smaller phrase technology. Any transcriptional errors that result from this process are unintentional.

## 2016-09-12 NOTE — Care Management Important Message (Signed)
Important Message  Patient Details  Name: Kenneth Sutton MRN: [id number] Date of Birth: 1951-03-21   Medicare Important Message Given:  Yes    Gwenette GreetBrenda S Cleotha Tsang, RN 09/12/2016, 8:36 AM
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
## 2016-09-12 NOTE — Care Management (Signed)
Admitted to this facility with the diagnosis of COPD. Attempted to get in touch with Glorious PeachMeraline Bullock, sister.  (not a working number). Telephone call to Narda BondsEster Barber [phone]). She lives in [place]. States that [place] lives in [place] [phone]). Another sister Trudie BucklerCathy Glapon lives in [place].  Dr. Kinnie Feilejansie is primary care physician. Suppose to have gone [facility] yesterday for [facility] examine. Brother lives in an apartment [address] 7-8 years. Prior to this apartment lived in Group Home in [place]. " Doesn't drink, but people bring alcohol to him." No home health. No skilled nursing. No home oxygen. Doesn't work or drive. Having difficulty with transportation. Sister said that the Department of Social Services pays his bills. Don't know if they are is guardian.  Gwenette GreetBrenda S Terryl Niziolek RN MSN CCM Care Management

## 2016-09-13 MED ORDER — QUETIAPINE FUMARATE 100 MG PO TABS: 100.0000 mg | ORAL_TABLET | Freq: Every day | ORAL | 0 refills | Status: AC

## 2016-09-13 NOTE — Care Management (Signed)
Discharge to home today per Dr. Sherryll BurgerShah. Dr. Sherryll BurgerShah spoke with sister, Matilde HaymakerMeraline. States she will transport. Gwenette GreetBrenda S Ezechiel Stooksbury RN MSN CCM Care Management
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
## 2017-04-19 ENCOUNTER — Encounter: Payer: Self-pay | Admitting: Podiatry

## 2017-04-19 ENCOUNTER — Ambulatory Visit (INDEPENDENT_AMBULATORY_CARE_PROVIDER_SITE_OTHER): Payer: Medicare Other | Admitting: Podiatry

## 2017-04-19 VITALS — BP 135/85 | HR 64

## 2017-04-19 DIAGNOSIS — Q828 Other specified congenital malformations of skin: Secondary | ICD-10-CM

## 2017-04-20 NOTE — Progress Notes (Signed)
   Subjective:    Patient ID: BURNETT SPRAY, male    DOB: 1951/03/17, 66 y.o.   MRN: [id number]  HPI this patient presents the office with chief complaint of long painful calluses on both feet.  He says he is not interested in any nail care at this visit.  He says the calluses are painful walking and wearing his shoes.  Patient presents the office today for an evaluation and treatment of his painful calluses.    Review of Systems  All other systems reviewed and are negative.      Objective:   Physical Exam General Appearance  Alert, conversant and in no acute stress.  Vascular  Dorsalis pedis and posterior pulses are absent  bilaterally.  Capillary return is within normal limits  Bilaterally. Temperature is within normal limits  Bilaterally  Neurologic  Senn-Weinstein monofilament wire test within normal limits  bilaterally. Muscle power  Within normal limits bilaterally.  Nails Thick disfigured discolored nails with subungual debride bilaterally from hallux to fifth toes bilaterally. No evidence of bacterial infection or drainage bilaterally.  Orthopedic  No limitations of motion of motion feet bilaterally.  HAV  B/L.  Contracted digits  B/L.  Skin  normotropic skin with noted bilaterally.  No signs of infections or ulcers noted.  Porokeratosis  Sub 5th met and heel left foot.  Hallux callus  B/L.       Assessment & Plan:  Porokeratosis  B/L  IE  Debride porokeratosis  B/L.  RTC 3 months   Gardiner Barefoot DPM

## 2017-07-01 ENCOUNTER — Emergency Department
Admission: EM | Admit: 2017-07-01 | Discharge: 2017-07-01 | Disposition: A | Discharge status: Home / Self Care | Payer: Medicare Other | Attending: Emergency Medicine | Admitting: Emergency Medicine

## 2017-07-01 ENCOUNTER — Other Ambulatory Visit: Payer: Self-pay

## 2017-07-01 DIAGNOSIS — Z79899 Other long term (current) drug therapy: Secondary | ICD-10-CM | POA: Diagnosis not present

## 2017-07-01 DIAGNOSIS — R0602 Shortness of breath: Secondary | ICD-10-CM | POA: Diagnosis present

## 2017-07-01 DIAGNOSIS — I10 Essential (primary) hypertension: Secondary | ICD-10-CM | POA: Diagnosis not present

## 2017-07-01 DIAGNOSIS — F17210 Nicotine dependence, cigarettes, uncomplicated: Secondary | ICD-10-CM | POA: Diagnosis not present

## 2017-07-01 DIAGNOSIS — J449 Chronic obstructive pulmonary disease, unspecified: Secondary | ICD-10-CM | POA: Diagnosis not present

## 2017-07-01 MED ORDER — IPRATROPIUM-ALBUTEROL 0.5-2.5 (3) MG/3ML IN SOLN
3.0000 mL | Freq: Once | RESPIRATORY_TRACT | Status: AC
  Administered 2017-07-01: 3 mL via RESPIRATORY_TRACT
  Filled 2017-07-01: qty 3

## 2017-07-01 MED ORDER — ALBUTEROL SULFATE HFA 108 (90 BASE) MCG/ACT IN AERS: 2.0000 | INHALATION_SPRAY | Freq: Four times a day (QID) | RESPIRATORY_TRACT | 0 refills | Status: AC | PRN

## 2017-07-01 NOTE — ED Triage Notes (Signed)
EMS states patient has been having difficulty breathing since yesterday. Pt has Hx of COPD, patient smokes.

## 2017-07-01 NOTE — Discharge Instructions (Signed)
Please seek medical attention for any high fevers, chest pain, shortness of breath, change in behavior, persistent vomiting, bloody stool or any other new or concerning symptoms.  
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
## 2017-07-01 NOTE — ED Notes (Signed)

## 2017-07-01 NOTE — ED Notes (Signed)
Called Guardian - Alisia FerrariMegan Tyson, [place] [facility]  [phone], Cell [phone] unable to leave message since mailbox is full as per recording.

## 2017-07-23 ENCOUNTER — Ambulatory Visit: Payer: Medicare Other | Admitting: Podiatry

## 2018-04-10 ENCOUNTER — Other Ambulatory Visit: Payer: Self-pay | Admitting: Gastroenterology

## 2018-04-10 DIAGNOSIS — R634 Abnormal weight loss: Secondary | ICD-10-CM

## 2018-04-10 DIAGNOSIS — Z720 Tobacco use: Secondary | ICD-10-CM

## 2018-04-10 DIAGNOSIS — J449 Chronic obstructive pulmonary disease, unspecified: Secondary | ICD-10-CM

## 2018-04-18 ENCOUNTER — Ambulatory Visit
Admission: RE | Admit: 2018-04-18 | Discharge: 2018-04-18 | Disposition: A | Discharge status: Home / Self Care | Payer: Medicare Other | Source: Ambulatory Visit | Attending: Gastroenterology | Admitting: Gastroenterology

## 2018-04-18 DIAGNOSIS — Q453 Other congenital malformations of pancreas and pancreatic duct: Secondary | ICD-10-CM | POA: Insufficient documentation

## 2018-04-18 DIAGNOSIS — Z720 Tobacco use: Secondary | ICD-10-CM | POA: Diagnosis present

## 2018-04-18 DIAGNOSIS — J449 Chronic obstructive pulmonary disease, unspecified: Secondary | ICD-10-CM | POA: Insufficient documentation

## 2018-04-18 DIAGNOSIS — R911 Solitary pulmonary nodule: Secondary | ICD-10-CM | POA: Insufficient documentation

## 2018-04-18 DIAGNOSIS — R634 Abnormal weight loss: Secondary | ICD-10-CM | POA: Diagnosis not present

## 2018-04-18 DIAGNOSIS — I2510 Atherosclerotic heart disease of native coronary artery without angina pectoris: Secondary | ICD-10-CM | POA: Diagnosis not present

## 2018-04-18 DIAGNOSIS — I700 Atherosclerosis of aorta: Secondary | ICD-10-CM | POA: Diagnosis not present

## 2018-04-18 HISTORY — DX: Heart failure, unspecified: I50.9

## 2018-04-18 MED ORDER — IOPAMIDOL (ISOVUE-300) INJECTION 61%
100.0000 mL | Freq: Once | INTRAVENOUS | Status: AC | PRN
  Administered 2018-04-18: 100 mL via INTRAVENOUS

## 2020-10-26 ENCOUNTER — Emergency Department: Payer: Medicare Other

## 2020-10-26 ENCOUNTER — Other Ambulatory Visit: Payer: Self-pay

## 2020-10-26 ENCOUNTER — Emergency Department
Admission: EM | Admit: 2020-10-26 | Discharge: 2020-10-26 | Disposition: A | Discharge status: Other Acute Inpatient Hospital | Payer: Medicare Other | Attending: Emergency Medicine | Admitting: Emergency Medicine

## 2020-10-26 DIAGNOSIS — J441 Chronic obstructive pulmonary disease with (acute) exacerbation: Secondary | ICD-10-CM | POA: Insufficient documentation

## 2020-10-26 DIAGNOSIS — F17210 Nicotine dependence, cigarettes, uncomplicated: Secondary | ICD-10-CM | POA: Diagnosis not present

## 2020-10-26 DIAGNOSIS — E860 Dehydration: Secondary | ICD-10-CM

## 2020-10-26 DIAGNOSIS — N390 Urinary tract infection, site not specified: Secondary | ICD-10-CM | POA: Insufficient documentation

## 2020-10-26 DIAGNOSIS — I509 Heart failure, unspecified: Secondary | ICD-10-CM | POA: Insufficient documentation

## 2020-10-26 DIAGNOSIS — I110 Hypertensive heart disease with heart failure: Secondary | ICD-10-CM | POA: Diagnosis not present

## 2020-10-26 DIAGNOSIS — Z79899 Other long term (current) drug therapy: Secondary | ICD-10-CM | POA: Insufficient documentation

## 2020-10-26 DIAGNOSIS — W19XXXA Unspecified fall, initial encounter: Secondary | ICD-10-CM | POA: Diagnosis not present

## 2020-10-26 DIAGNOSIS — F0390 Unspecified dementia without behavioral disturbance: Secondary | ICD-10-CM | POA: Insufficient documentation

## 2020-10-26 LAB — COMPREHENSIVE METABOLIC PANEL
ALT: 20 U/L (ref 0–44)
AST: 45 U/L — ABNORMAL HIGH (ref 15–41)
Albumin: 4 g/dL (ref 3.5–5.0)
Alkaline Phosphatase: 63 U/L (ref 38–126)
Anion gap: 10 (ref 5–15)
BUN: 33 mg/dL — ABNORMAL HIGH (ref 8–23)
CO2: 24 mmol/L (ref 22–32)
Calcium: 8.7 mg/dL — ABNORMAL LOW (ref 8.9–10.3)
Chloride: 100 mmol/L (ref 98–111)
Creatinine, Ser: 0.92 mg/dL (ref 0.61–1.24)
GFR, Estimated: >60 mL/min (ref >60)
Glucose, Bld: 99 mg/dL (ref 70–99)
Potassium: 4.7 mmol/L (ref 3.5–5.1)
Sodium: 134 mmol/L — ABNORMAL LOW (ref 135–145)
Total Bilirubin: 0.8 mg/dL (ref 0.3–1.2)
Total Protein: 7.3 g/dL (ref 6.5–8.1)

## 2020-10-26 LAB — URINALYSIS, COMPLETE (UACMP) WITH MICROSCOPIC
Bilirubin Urine: NEGATIVE
Glucose, UA: 50 mg/dL — AB
Hgb urine dipstick: NEGATIVE
Ketones, ur: 5 mg/dL — AB
Leukocytes,Ua: NEGATIVE
Nitrite: POSITIVE — AB
Protein, ur: NEGATIVE mg/dL
Specific Gravity, Urine: 1.016 (ref 1.005–1.030)
pH: 6 (ref 5.0–8.0)

## 2020-10-26 LAB — CBC
HCT: 37.1 % — ABNORMAL LOW (ref 39.0–52.0)
Hemoglobin: 12.4 g/dL — ABNORMAL LOW (ref 13.0–17.0)
MCH: 30.1 pg (ref 26.0–34.0)
MCHC: 33.4 g/dL (ref 30.0–36.0)
MCV: 90 fL (ref 80.0–100.0)
Platelets: 236 10*3/uL (ref 150–400)
RBC: 4.12 MIL/uL — ABNORMAL LOW (ref 4.22–5.81)
RDW: 13.7 % (ref 11.5–15.5)
WBC: 14.7 10*3/uL — ABNORMAL HIGH (ref 4.0–10.5)
nRBC: 0 % (ref 0.0–0.2)

## 2020-10-26 LAB — CK: Total CK: 677 U/L — ABNORMAL HIGH (ref 49–397)

## 2020-10-26 MED ORDER — SODIUM CHLORIDE 0.9 % IV SOLN
1.0000 g | Freq: Once | INTRAVENOUS | Status: AC
  Administered 2020-10-26: 1 g via INTRAVENOUS
  Filled 2020-10-26: qty 10

## 2020-10-26 MED ORDER — CEPHALEXIN 500 MG PO CAPS: 500.0000 mg | ORAL_CAPSULE | Freq: Two times a day (BID) | ORAL | 0 refills | Status: AC

## 2020-10-26 NOTE — ED Notes (Signed)
Nurse Tech Tammy called to inform of pt discharge to come pick up pt and transport back to facility. NT Tammy agreed stating she's on the way
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
## 2020-10-26 NOTE — ED Notes (Signed)
Pt back from CT

## 2020-10-26 NOTE — ED Notes (Signed)
Labs drawn and sent down. EKG obtained.
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
## 2020-10-26 NOTE — Discharge Instructions (Addendum)
Please drink plenty of fluids over the next several days.  Please take your antibiotics as prescribed for their entire course.  Return to the emergency department for any symptom personally concerning to yourself or staff members.

## 2020-10-26 NOTE — ED Notes (Signed)
Labwork given to [facility] EDT to walk down to lab, due to tube machine not working at this time

## 2020-10-26 NOTE — ED Notes (Signed)
Pt legal guardian called and informed of pt discharge at this time. Legal guardian on file, Jeralene Huff, no longer working in that role. Gave report and update on pt status to Theodis Shove.
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
## 2020-10-28 LAB — URINE CULTURE: Culture: >=100000 — AB

## 2021-02-03 ENCOUNTER — Emergency Department: Payer: Medicare Other

## 2021-02-03 ENCOUNTER — Emergency Department
Admission: EM | Admit: 2021-02-03 | Discharge: 2021-02-03 | Disposition: A | Discharge status: Home / Self Care | Payer: Medicare Other | Attending: Emergency Medicine | Admitting: Emergency Medicine

## 2021-02-03 ENCOUNTER — Other Ambulatory Visit: Payer: Self-pay

## 2021-02-03 DIAGNOSIS — I110 Hypertensive heart disease with heart failure: Secondary | ICD-10-CM | POA: Insufficient documentation

## 2021-02-03 DIAGNOSIS — K5900 Constipation, unspecified: Secondary | ICD-10-CM | POA: Insufficient documentation

## 2021-02-03 DIAGNOSIS — F0390 Unspecified dementia without behavioral disturbance: Secondary | ICD-10-CM | POA: Insufficient documentation

## 2021-02-03 DIAGNOSIS — Z7951 Long term (current) use of inhaled steroids: Secondary | ICD-10-CM | POA: Diagnosis not present

## 2021-02-03 DIAGNOSIS — I509 Heart failure, unspecified: Secondary | ICD-10-CM | POA: Insufficient documentation

## 2021-02-03 DIAGNOSIS — R109 Unspecified abdominal pain: Secondary | ICD-10-CM | POA: Diagnosis present

## 2021-02-03 DIAGNOSIS — J441 Chronic obstructive pulmonary disease with (acute) exacerbation: Secondary | ICD-10-CM | POA: Diagnosis not present

## 2021-02-03 DIAGNOSIS — Z79899 Other long term (current) drug therapy: Secondary | ICD-10-CM | POA: Insufficient documentation

## 2021-02-03 DIAGNOSIS — F17210 Nicotine dependence, cigarettes, uncomplicated: Secondary | ICD-10-CM | POA: Insufficient documentation

## 2021-02-03 LAB — COMPREHENSIVE METABOLIC PANEL
ALT: 16 U/L (ref 0–44)
AST: 27 U/L (ref 15–41)
Albumin: 4.2 g/dL (ref 3.5–5.0)
Alkaline Phosphatase: 65 U/L (ref 38–126)
Anion gap: 11 (ref 5–15)
BUN: 21 mg/dL (ref 8–23)
CO2: 29 mmol/L (ref 22–32)
Calcium: 9.7 mg/dL (ref 8.9–10.3)
Chloride: 96 mmol/L — ABNORMAL LOW (ref 98–111)
Creatinine, Ser: 1.22 mg/dL (ref 0.61–1.24)
GFR, Estimated: >60 mL/min (ref >60)
Glucose, Bld: 67 mg/dL — ABNORMAL LOW (ref 70–99)
Potassium: 4.6 mmol/L (ref 3.5–5.1)
Sodium: 136 mmol/L (ref 135–145)
Total Bilirubin: 0.7 mg/dL (ref 0.3–1.2)
Total Protein: 7.8 g/dL (ref 6.5–8.1)

## 2021-02-03 LAB — URINALYSIS, COMPLETE (UACMP) WITH MICROSCOPIC
Bacteria, UA: NONE SEEN
Bilirubin Urine: NEGATIVE
Glucose, UA: 150 mg/dL — AB
Hgb urine dipstick: NEGATIVE
Ketones, ur: NEGATIVE mg/dL
Leukocytes,Ua: NEGATIVE
Nitrite: NEGATIVE
Protein, ur: NEGATIVE mg/dL
Specific Gravity, Urine: 1.019 (ref 1.005–1.030)
Squamous Epithelial / HPF: NONE SEEN (ref 0–5)
pH: 7 (ref 5.0–8.0)

## 2021-02-03 LAB — CBC
HCT: 43.4 % (ref 39.0–52.0)
Hemoglobin: 14.1 g/dL (ref 13.0–17.0)
MCH: 30.7 pg (ref 26.0–34.0)
MCHC: 32.5 g/dL (ref 30.0–36.0)
MCV: 94.3 fL (ref 80.0–100.0)
Platelets: 233 10*3/uL (ref 150–400)
RBC: 4.6 MIL/uL (ref 4.22–5.81)
RDW: 13.2 % (ref 11.5–15.5)
WBC: 5.6 10*3/uL (ref 4.0–10.5)
nRBC: 0 % (ref 0.0–0.2)

## 2021-02-03 LAB — LIPASE, BLOOD: Lipase: 33 U/L (ref 11–51)

## 2021-02-03 MED ORDER — DEXTROSE 50 % IV SOLN
25.0000 mL | Freq: Once | INTRAVENOUS | Status: AC
  Administered 2021-02-03: 25 mL via INTRAVENOUS
  Filled 2021-02-03: qty 50

## 2021-02-03 MED ORDER — HALOPERIDOL LACTATE 5 MG/ML IJ SOLN
5.0000 mg | Freq: Once | INTRAMUSCULAR | Status: AC
  Administered 2021-02-03: 5 mg via INTRAMUSCULAR
  Filled 2021-02-03: qty 1

## 2021-02-03 MED ORDER — DOCUSATE SODIUM 100 MG PO CAPS: 100.0000 mg | ORAL_CAPSULE | Freq: Every day | ORAL | 0 refills | Status: AC

## 2021-02-03 MED ORDER — IOHEXOL 350 MG/ML SOLN
80.0000 mL | Freq: Once | INTRAVENOUS | Status: AC | PRN
  Administered 2021-02-03: 80 mL via INTRAVENOUS
  Filled 2021-02-03: qty 80

## 2021-02-03 MED ORDER — LACTULOSE 10 GM/15ML PO SOLN: 20.0000 g | Freq: Once | ORAL | Status: DC

## 2021-02-03 MED ORDER — SODIUM CHLORIDE 0.9 % IV BOLUS
1000.0000 mL | Freq: Once | INTRAVENOUS | Status: AC
  Administered 2021-02-03: 1000 mL via INTRAVENOUS

## 2021-02-03 MED ORDER — POLYETHYLENE GLYCOL 3350 17 G PO PACK: PACK | ORAL | 0 refills | Status: AC

## 2021-02-03 NOTE — ED Notes (Signed)
Patient provided with clean gown and clean brief at this time. Transport here to take patient home.

## 2021-02-03 NOTE — ED Notes (Signed)
MD at bedside. 

## 2021-02-03 NOTE — ED Notes (Signed)
Patient provided with soda, crackers and PB.
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
## 2021-02-03 NOTE — ED Notes (Signed)
Upon entering room patient standing at edge of bed, patient noted to have urinated on himself, pants and brief wet, patient placed in hospital gown and yellow socks. Patient refused to take off pants, fighting with staff and agitated.

## 2021-02-03 NOTE — ED Triage Notes (Signed)
Pt comes via EMS from [place] [facility] with c/p right sided abdominal pain. Pt denies any N/V. Pt denies any urinary symptoms.  139/87 63 97%RA 140-cbg

## 2021-12-21 IMAGING — CT CT HEAD W/O CM
3 of 4 series · 15 of 47 positions shown, 18 images · non-contrast
Comparison: None.

CLINICAL DATA: Minor head trauma

EXAM:
CT HEAD WITHOUT CONTRAST
TECHNIQUE: Contiguous axial images were obtained from the base of the skull
through the vertex without intravenous contrast.

[Series 2: head wo · axial · 0.45mm/px · z∈[-123,+2]mm · 9 of 31 slices shown, 12 images]
[im 3/31  brain]
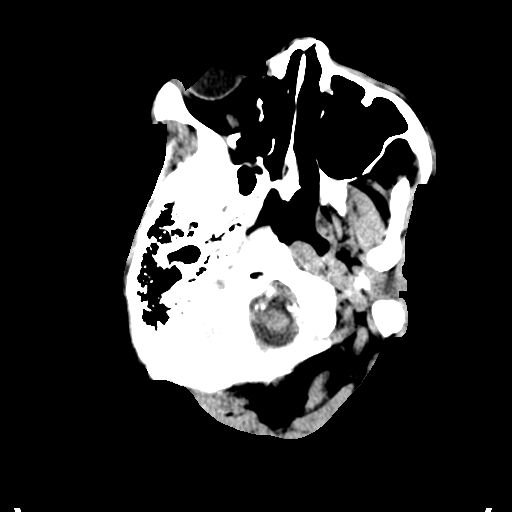
[im 3/31  bone]
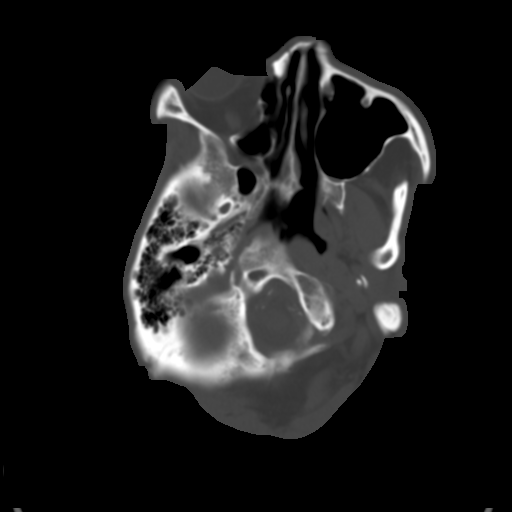
[im 7/31  brain]
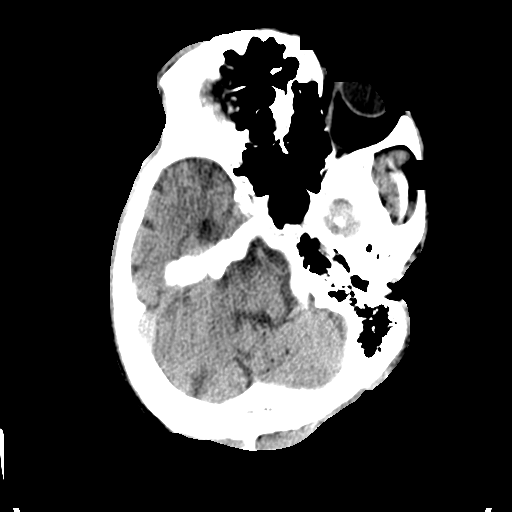
[im 9/31  brain]
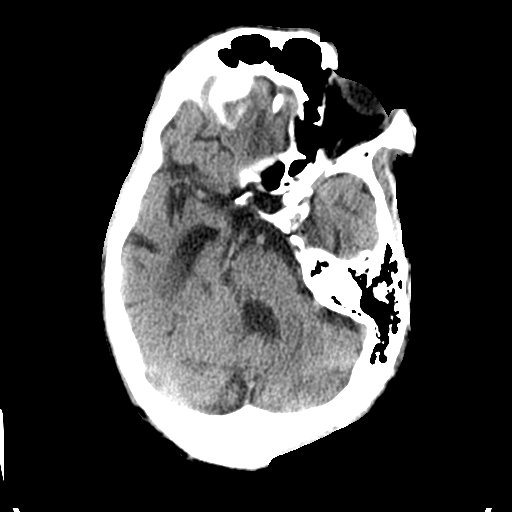
[im 13/31  brain]
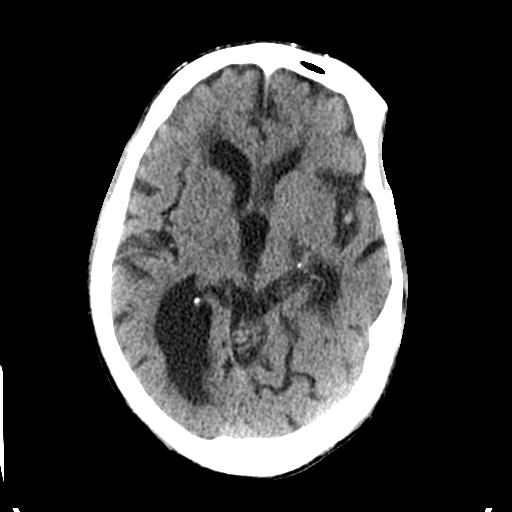
[im 16/31  brain]
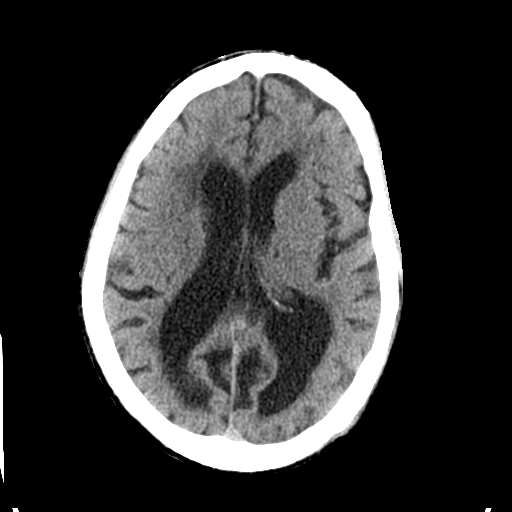
[im 16/31  bone]
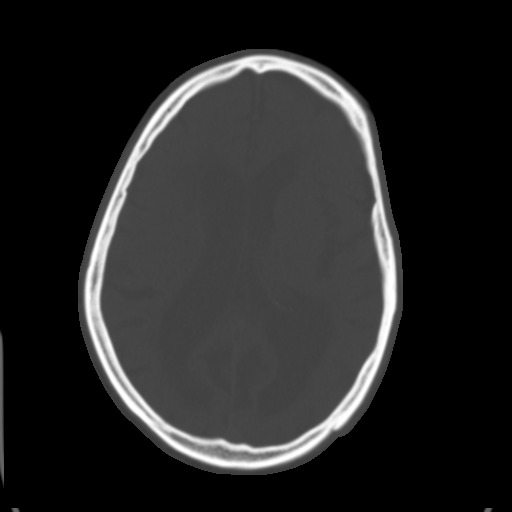
[im 18/31  brain]
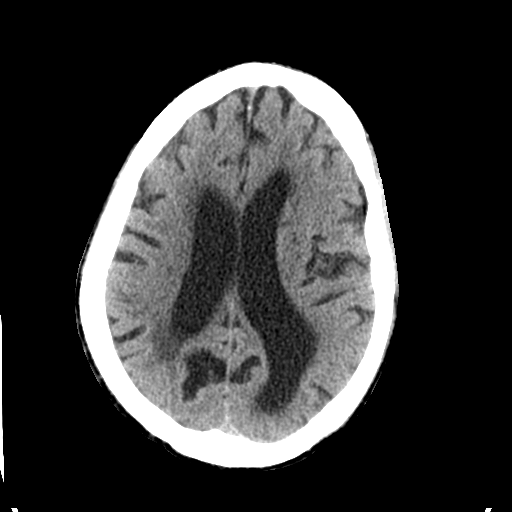
[im 22/31  brain]
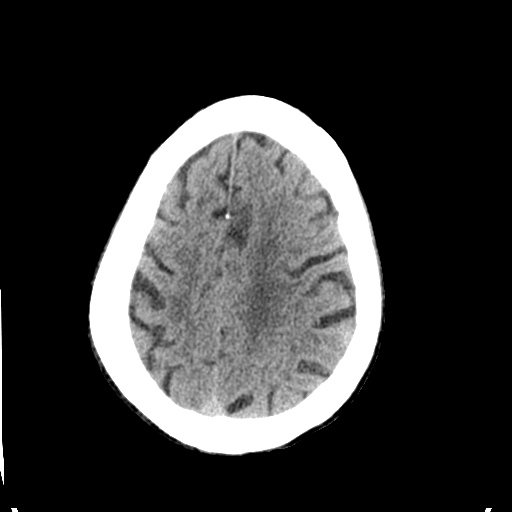
[im 24/31  brain]
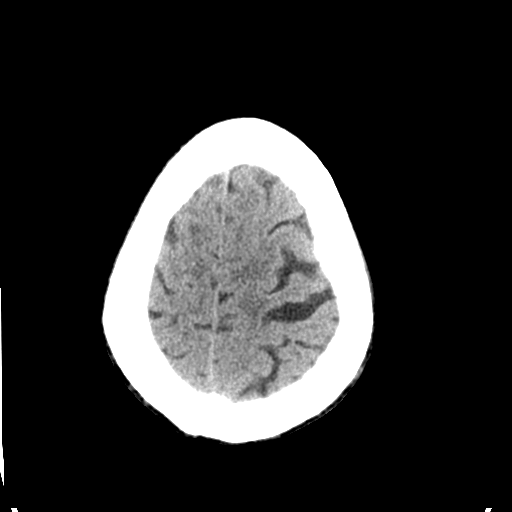
[im 28/31  brain]
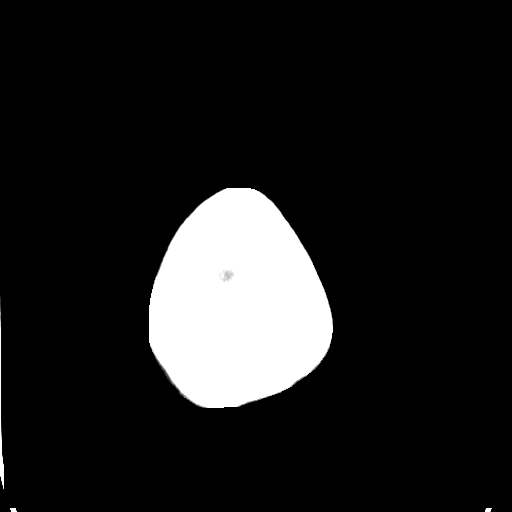
[im 28/31  bone]
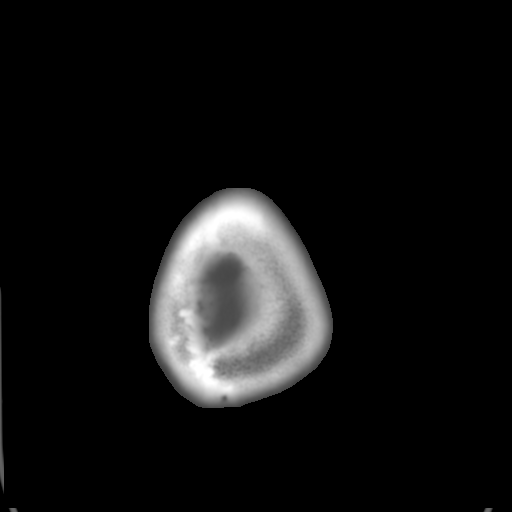

[Series 5: coronal soft tissue · coronal · 0.31mm/px · 3 of 70 slices shown]
[im 24/70  brain]
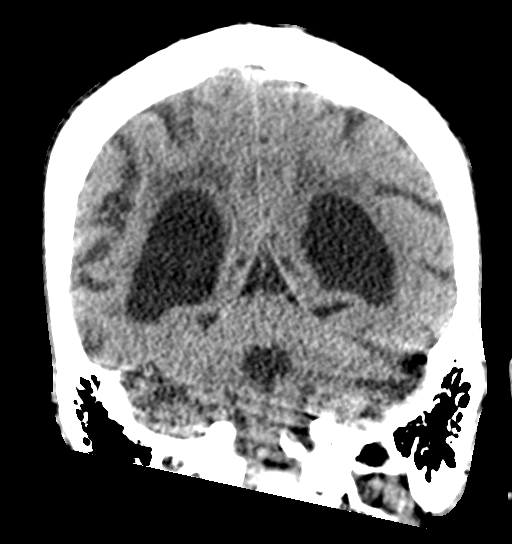
[im 31/70  brain]
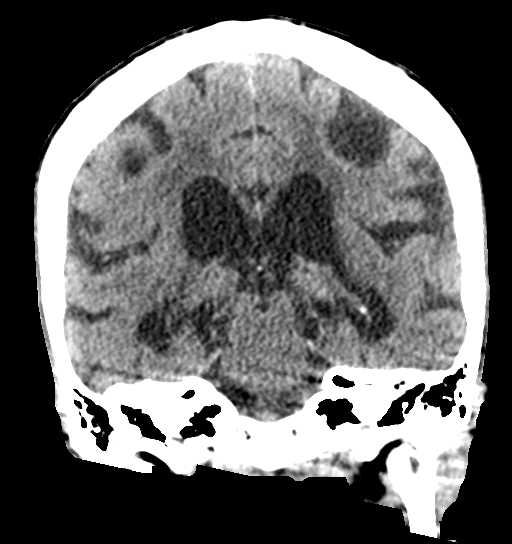
[im 39/70  brain]
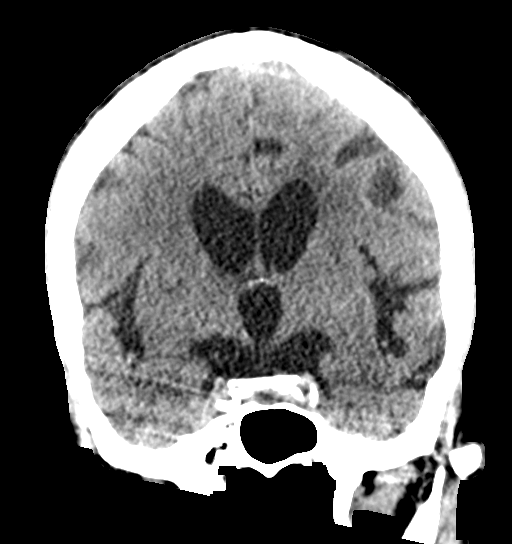

[Series 6: sagittal soft tissue · sagittal · 0.33mm/px · 3 of 53 slices shown]
[im 21/53  brain]
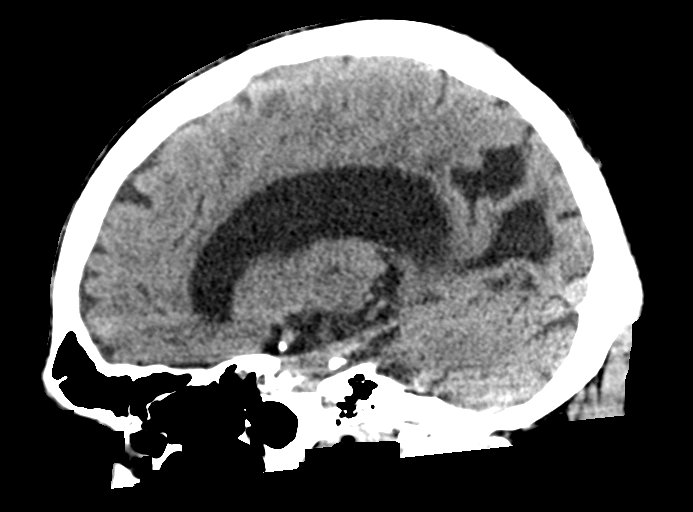
[im 27/53  brain]
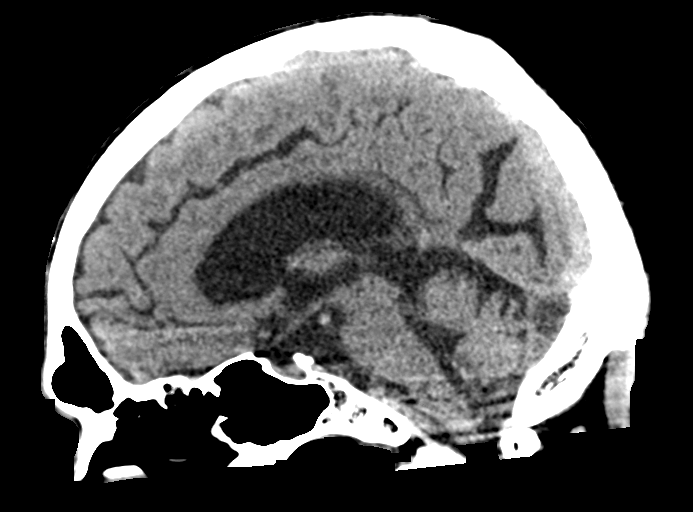
[im 32/53  brain]
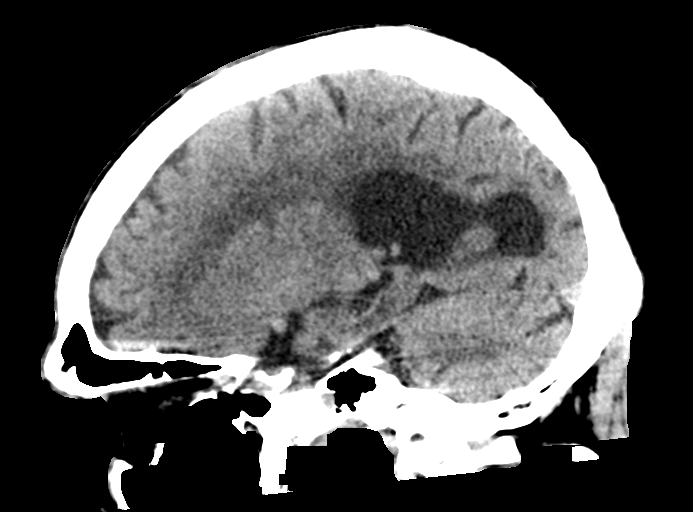

[15 of 47 positions shown; findings below may reference images not displayed]

FINDINGS: Brain: No evidence of acute infarction, hemorrhage, hydrocephalus,
extra-axial collection or mass lesion/mass effect. Significant brain
atrophy especially in the medial temporal and occipital parietal
regions. Chronic small vessel ischemic change in the cerebral white
matter and right thalamus.

Vascular: No hyperdense vessel or unexpected calcification.

Skull: Normal. Negative for fracture or focal lesion.

Sinuses/Orbits: No acute finding.
IMPRESSION: 1. No acute finding.
2. Prominent brain atrophy.  Chronic small vessel disease.

## 2021-12-24 DEATH — deceased

## 2022-03-31 IMAGING — CT CT ABD-PELV W/ CM
2 of 5 series · 16 of 46 positions shown, 18 images · IV contrast (APPLIED)
Comparison: 04/18/2018

CLINICAL DATA: Right lower quadrant pain

EXAM:
CT ABDOMEN AND PELVIS WITH CONTRAST
TECHNIQUE: Multidetector CT imaging of the abdomen and pelvis was performed
using the standard protocol following bolus administration of
intravenous contrast.
CONTRAST:  80mL OMNIPAQUE IOHEXOL 350 MG/ML SOLN

[Series 2: routine abd/pel with · axial · 0.72mm/px · z∈[-556,-181]mm · 13 of 85 slices shown, 15 images]
[im 5/85  soft-tissue]
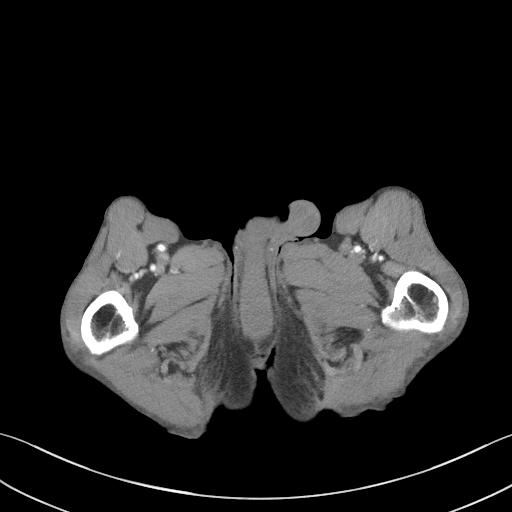
[im 5/85  bone]
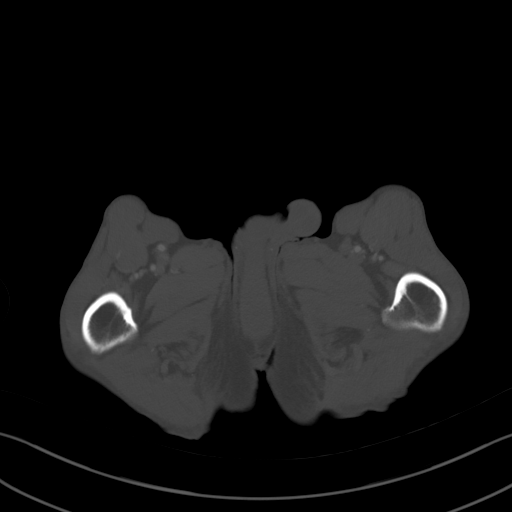
[im 14/85  soft-tissue]
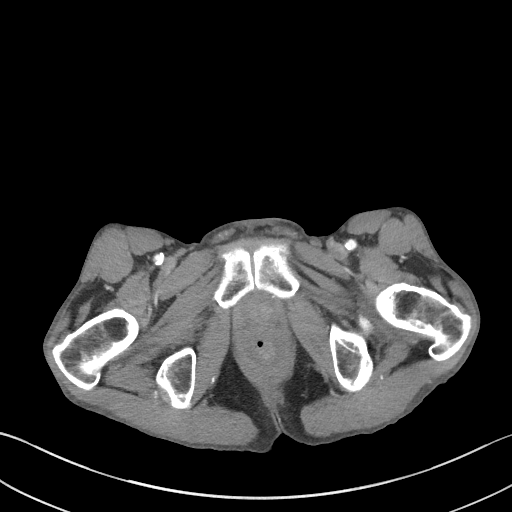
[im 18/85  soft-tissue]
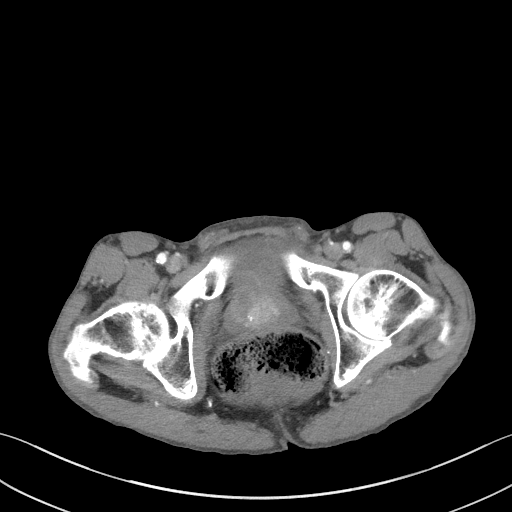
[im 23/85  soft-tissue]
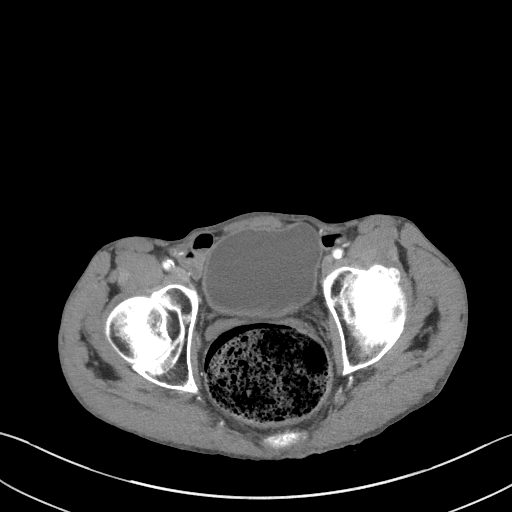
[im 31/85  soft-tissue]
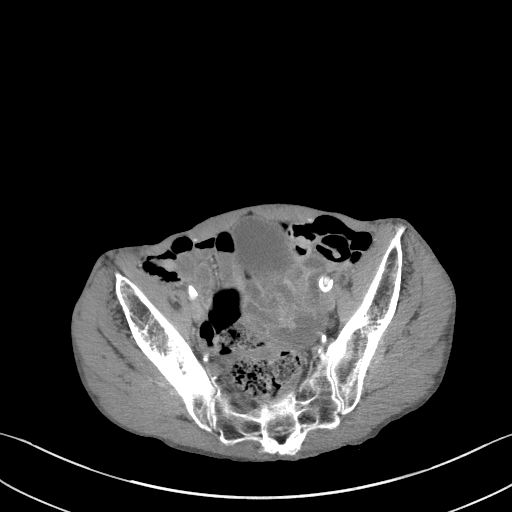
[im 36/85  soft-tissue]
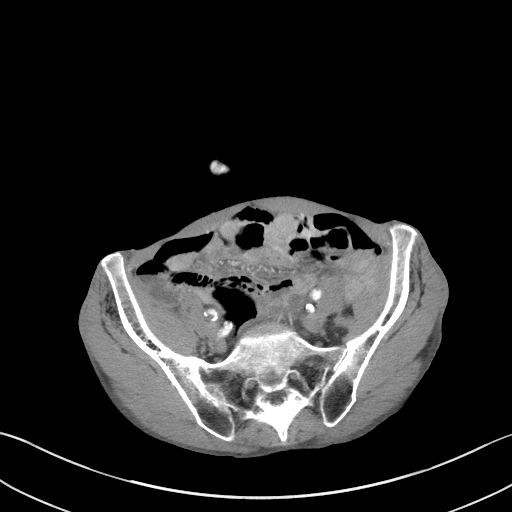
[im 45/85  soft-tissue]
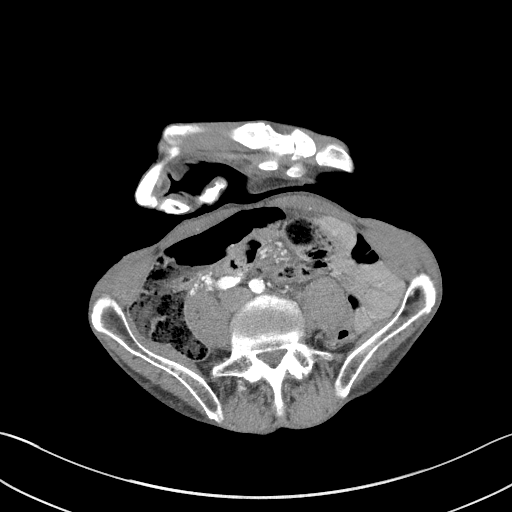
[im 49/85  soft-tissue]
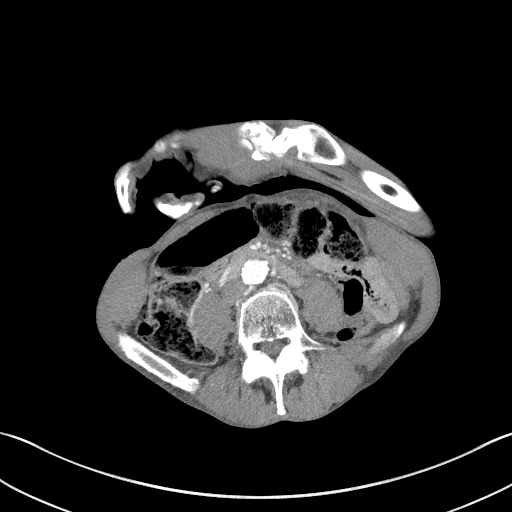
[im 54/85  soft-tissue]
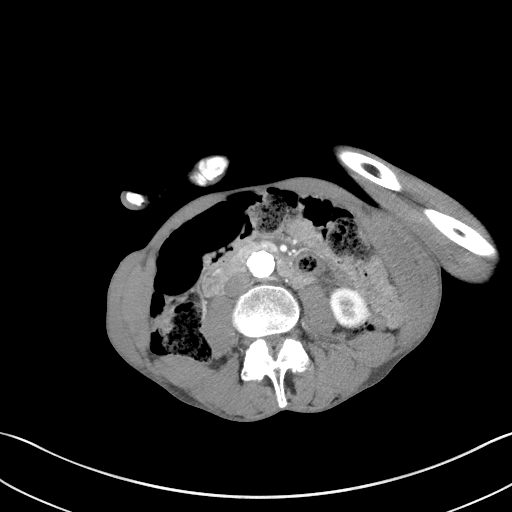
[im 54/85  bone]
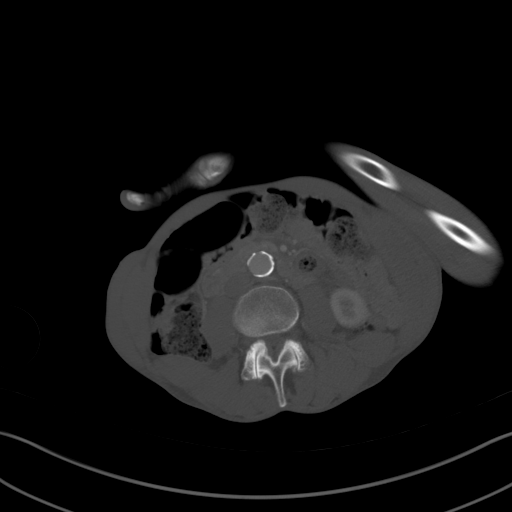
[im 62/85  soft-tissue]
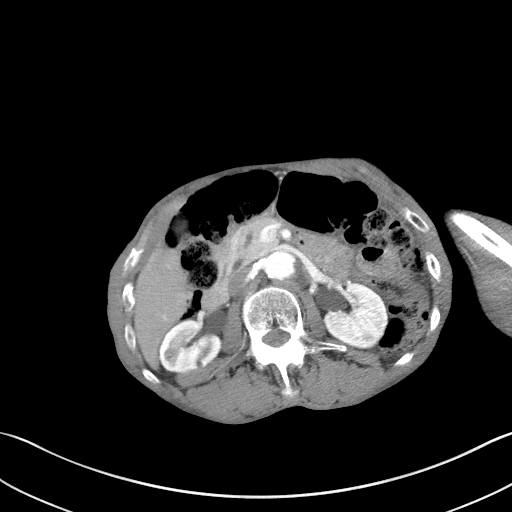
[im 67/85  soft-tissue]
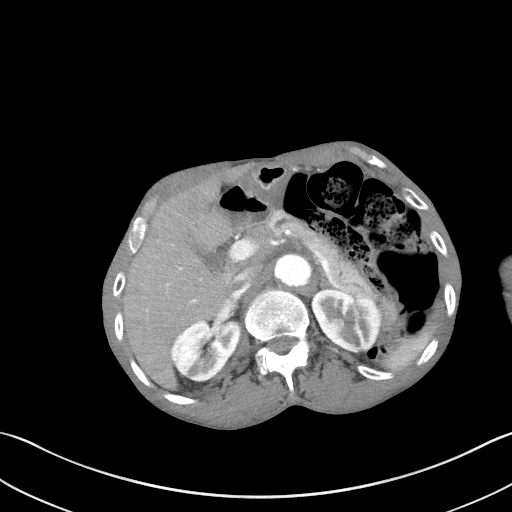
[im 71/85  soft-tissue]
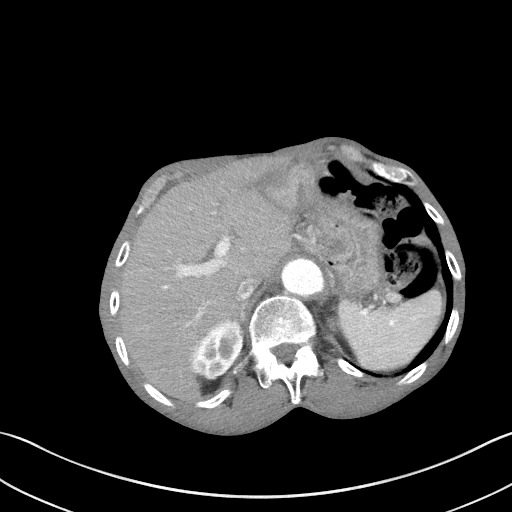
[im 80/85  soft-tissue]
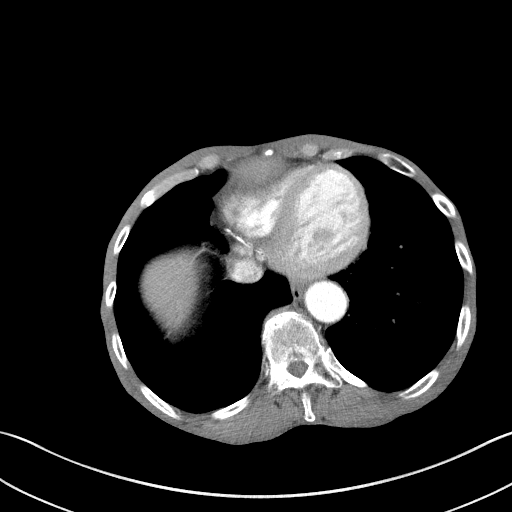

[Series 5: coronal st · coronal · 0.67mm/px · 3 of 86 slices shown]
[im 29/86  soft-tissue]
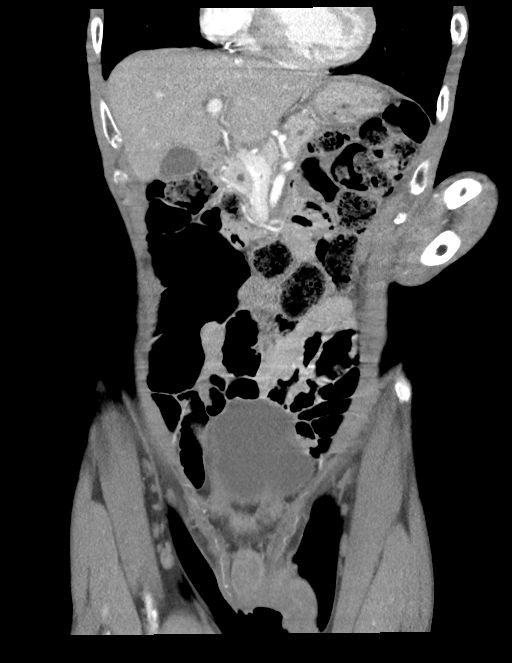
[im 38/86  soft-tissue]
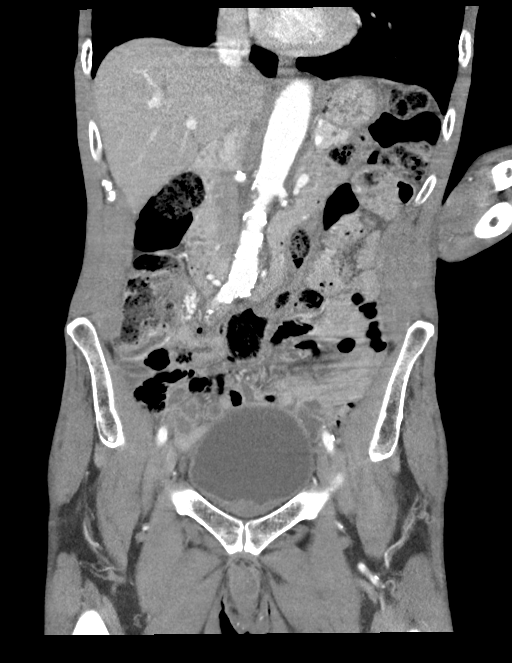
[im 48/86  soft-tissue]
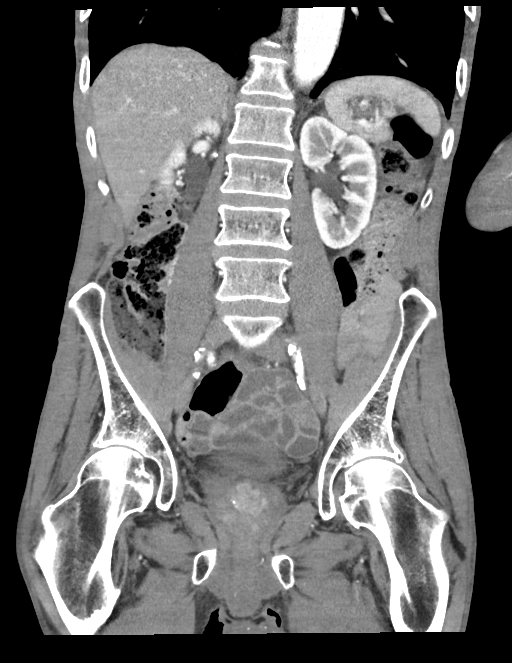

[16 of 46 positions shown; findings below may reference images not displayed]

FINDINGS: Lower chest: No acute abnormality.

Hepatobiliary: No focal liver abnormality is seen. No gallstones,
gallbladder wall thickening, or biliary dilatation.

Pancreas: Pancreas appears normal. No inflammation or mass. Chronic,
mild increase caliber of the main duct which measures 3 mm and
appears unchanged from 04/18/2018.

Spleen: Normal in size without focal abnormality.

Adrenals/Urinary Tract: Normal adrenal glands. No kidney stones or
hydronephrosis identified. Small right kidney cysts. No suspicious
kidney mass identified. Urinary bladder is unremarkable.

Stomach/Bowel: Stomach appears normal. Moderate stool burden noted
throughout the colon. Large desiccated stool ball is noted within
the rectum. No pathologic dilatation of the large or small bowel
loops. The appendix is difficult to confidently identified separate
from the right lower quadrant bowel loops. No pericecal inflammation
identified.

Vascular/Lymphatic: Aortic atherosclerosis without aneurysm. No
abdominopelvic adenopathy.

Reproductive: Mild prostate gland enlargement.

Other: No free fluid or fluid collections.

Musculoskeletal: No acute or significant osseous findings.
IMPRESSION: 1. No acute findings identified within the abdomen or pelvis.
Although the appendix is not visualized separate from the right
lower quadrant bowel loops there are no pericecal inflammatory
changes identified.
2. Moderate stool burden noted throughout the colon with large
desiccated stool ball within the rectum. Correlate for any clinical
signs or symptoms of constipation.
3. Aortic atherosclerosis.

Aortic Atherosclerosis ([id number]).
